# Patient Record
Sex: Female | Born: 1945 | Race: White | Hispanic: No | Smoking: Former smoker
Health system: Southern US, Community
[De-identification: ages and names within clinical notes are randomized; demographics above are authoritative.]

## PROBLEM LIST (undated history)

## (undated) ENCOUNTER — Emergency Department: Payer: Medicare HMO

## (undated) DIAGNOSIS — C801 Malignant (primary) neoplasm, unspecified: Secondary | ICD-10-CM

## (undated) DIAGNOSIS — K219 Gastro-esophageal reflux disease without esophagitis: Secondary | ICD-10-CM

## (undated) DIAGNOSIS — E119 Type 2 diabetes mellitus without complications: Secondary | ICD-10-CM

## (undated) DIAGNOSIS — I10 Essential (primary) hypertension: Secondary | ICD-10-CM

## (undated) DIAGNOSIS — I639 Cerebral infarction, unspecified: Secondary | ICD-10-CM

## (undated) DIAGNOSIS — J449 Chronic obstructive pulmonary disease, unspecified: Secondary | ICD-10-CM

## (undated) DIAGNOSIS — M1990 Unspecified osteoarthritis, unspecified site: Secondary | ICD-10-CM

## (undated) DIAGNOSIS — R609 Edema, unspecified: Secondary | ICD-10-CM

## (undated) DIAGNOSIS — R002 Palpitations: Secondary | ICD-10-CM

## (undated) HISTORY — PX: JOINT REPLACEMENT: SHX530

## (undated) HISTORY — PX: ABDOMINAL HYSTERECTOMY: SHX81

---

## 2002-06-29 DIAGNOSIS — I639 Cerebral infarction, unspecified: Secondary | ICD-10-CM

## 2002-06-29 HISTORY — DX: Cerebral infarction, unspecified: I63.9

## 2009-02-12 ENCOUNTER — Ambulatory Visit: Payer: Self-pay | Admitting: Internal Medicine

## 2009-04-11 ENCOUNTER — Ambulatory Visit: Payer: Self-pay | Admitting: Family Medicine

## 2009-05-21 ENCOUNTER — Ambulatory Visit: Payer: Self-pay | Admitting: Family Medicine

## 2009-08-29 ENCOUNTER — Ambulatory Visit: Payer: Self-pay | Admitting: Gastroenterology

## 2009-09-12 ENCOUNTER — Ambulatory Visit: Payer: Self-pay | Admitting: Gastroenterology

## 2010-01-11 IMAGING — MG MM CAD SCREENING MAMMO
1 series · 7 of 7 positions shown · non-contrast
Comparison: none

REASON FOR EXAM: scr mammo
COMMENTS:

PROCEDURE:     MAM - MAM DGTL SCREENING MAMMO W/CAD  - May 21, 2009  [DATE]
RESULT:     Comparison is made to 06/16/1996, 07/25/2001 and 08/05/2001.

[R CC · right · 7 of 7 slices shown]
[im 1/7]
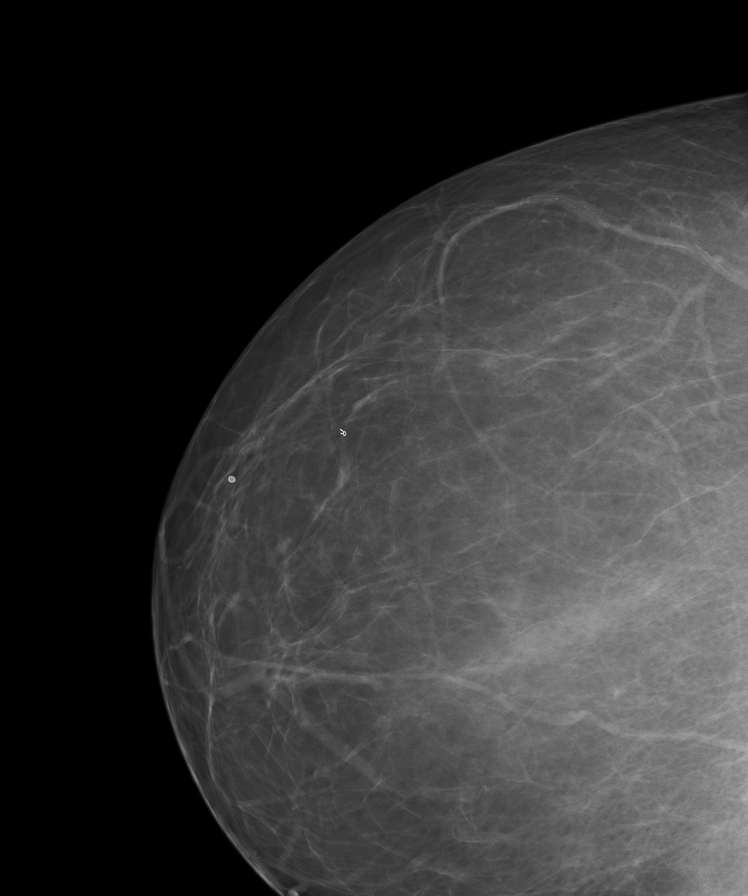
[im 2/7]
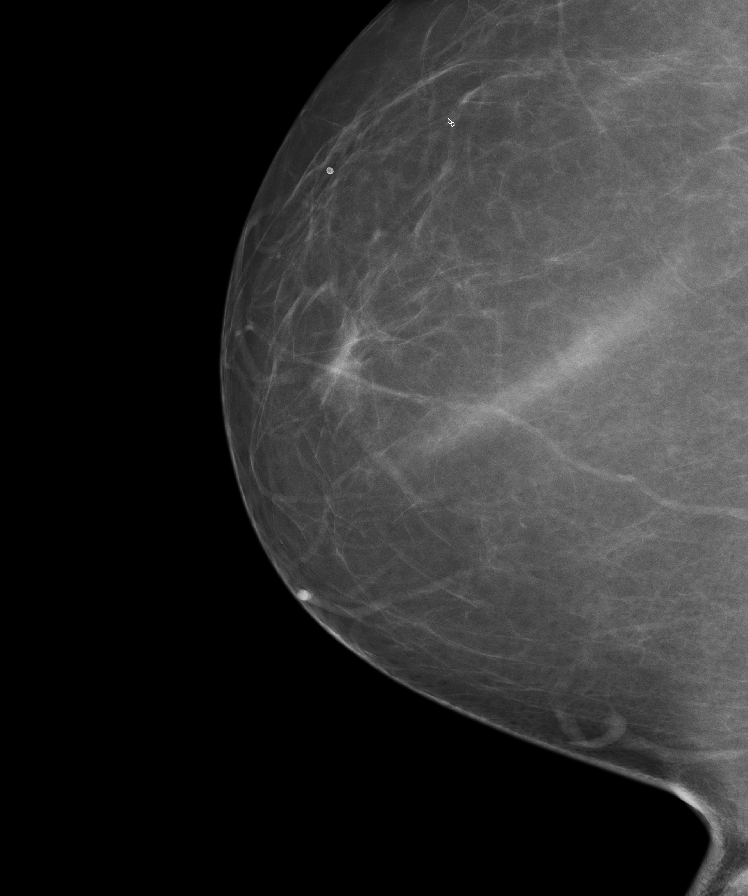
[im 3/7]
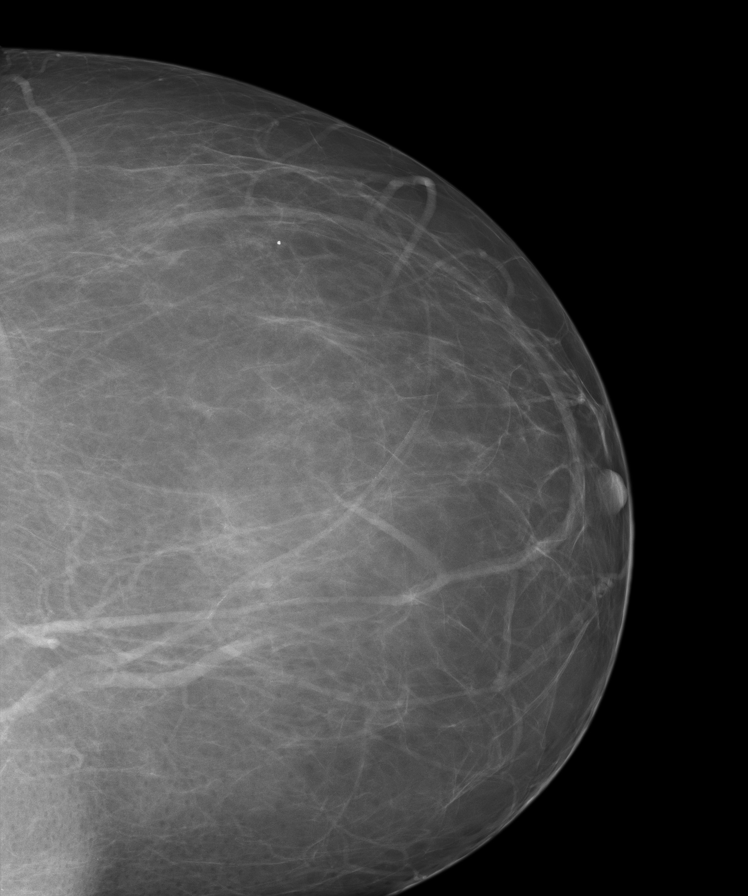
[im 4/7]
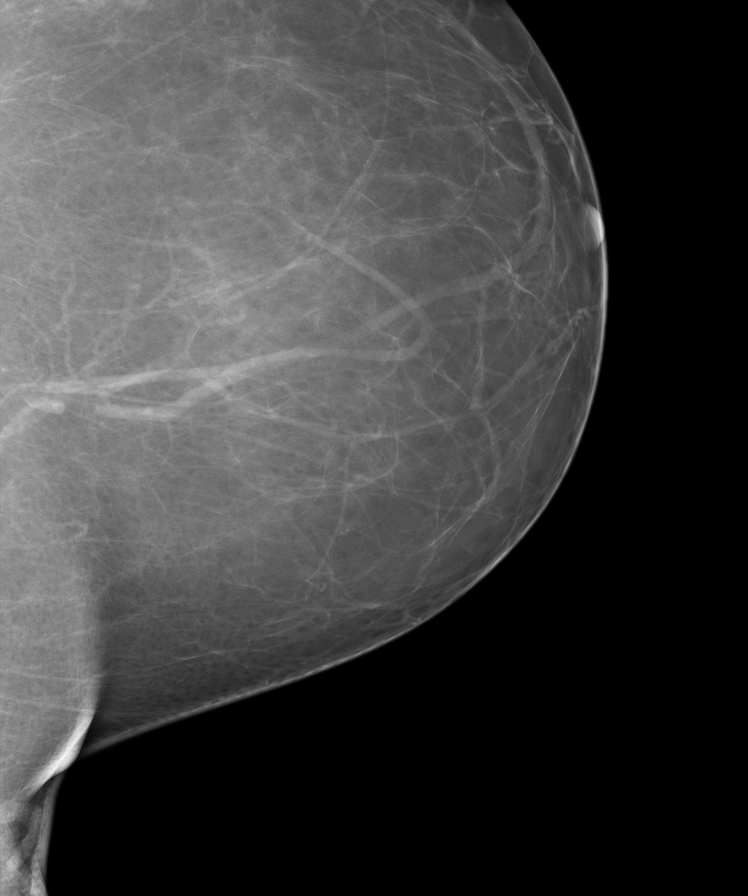
[im 5/7]
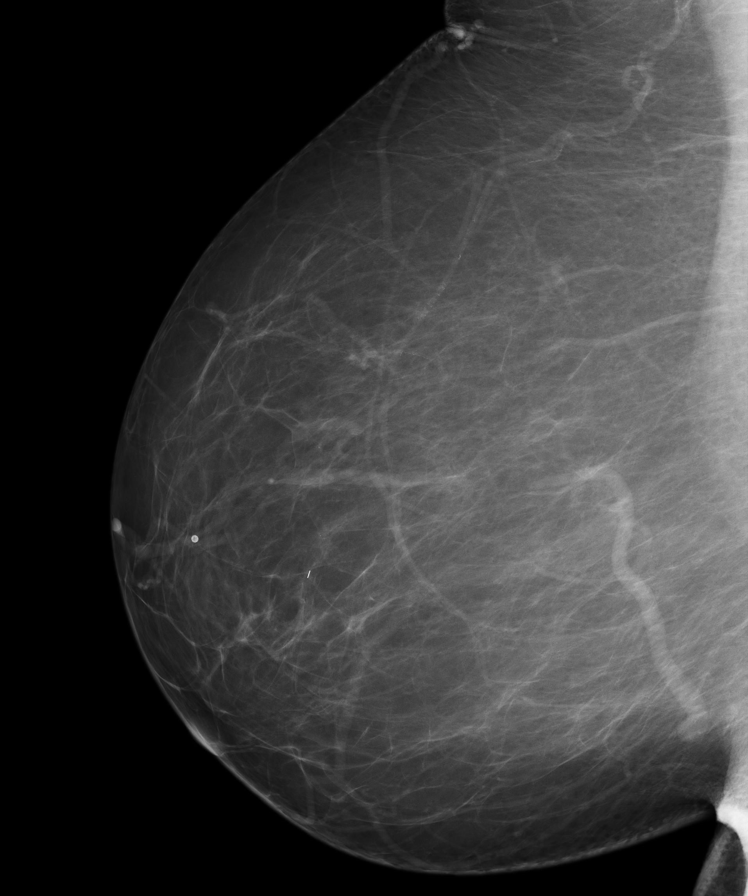
[im 6/7]
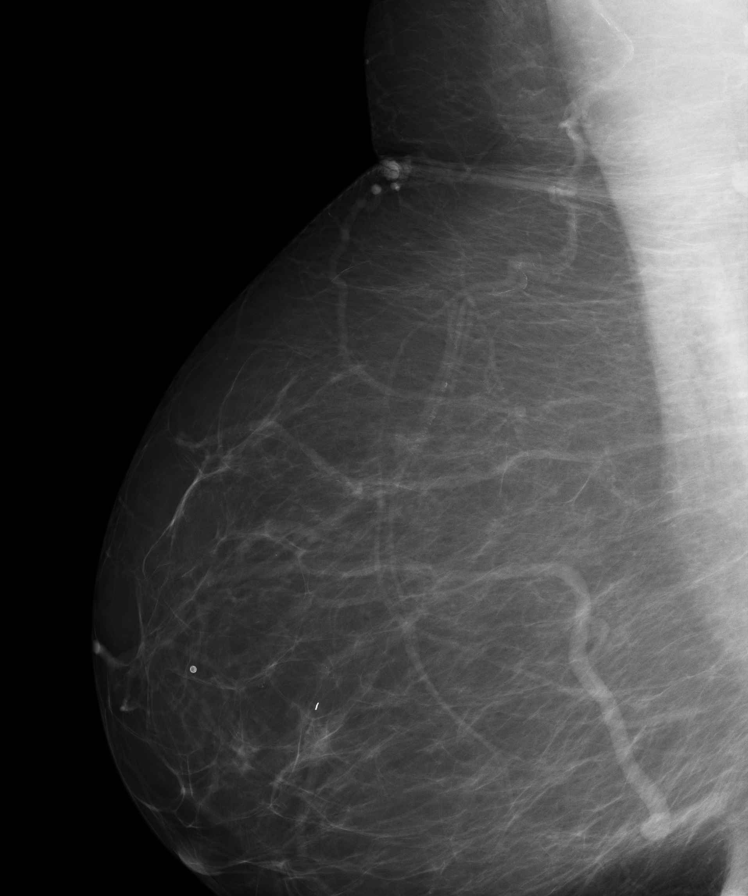
[im 7/7]
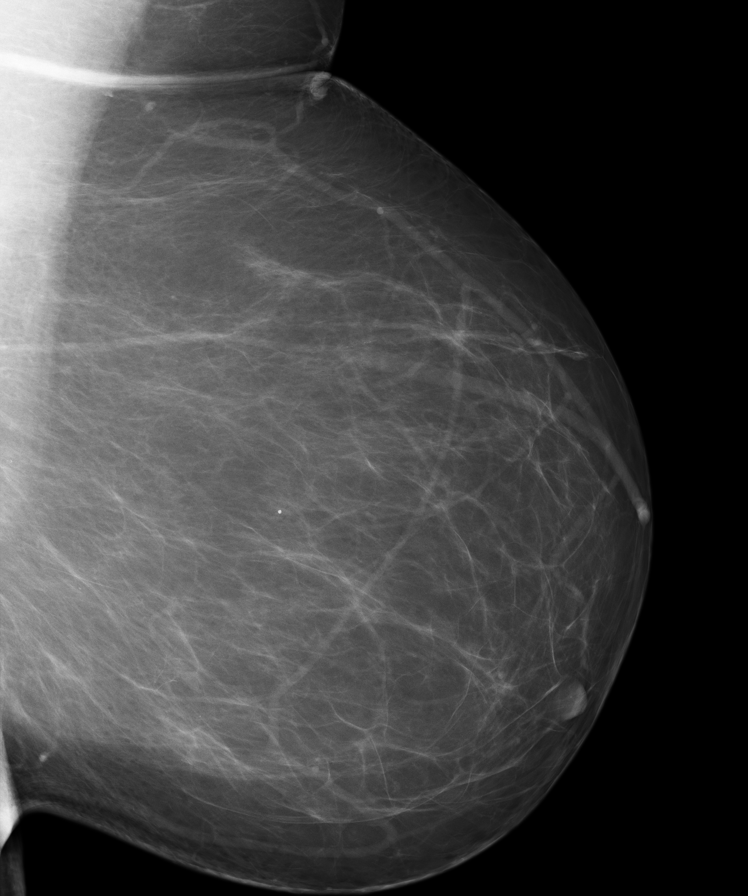

[7 of 7 positions shown; findings below may reference images not displayed]

FINDINGS: Bilateral breasts demonstrate a primarily fat density. There is no
dominant mass, architectural distortion or clusters of suspicious appearing
microcalcifications. There is a biopsy clip in the inferior lateral right
breast.
IMPRESSION: 1.      Stable bilateral mammogram.
2.     Annual mammographic follow-up is recommended.

BI-RADS: Category 2 - Benign Findings

Thank you for this opportunity to contribute to the care of your patient.

A NEGATIVE MAMMOGRAM REPORT DOES NOT PRECLUDE BIOPSY OR OTHER EVALUATION OF
A CLINICALLY PALPABLE OR OTHERWISE SUSPICIOUS MASS OR LESION. BREAST CANCER
MAY NOT BE DETECTED BY MAMMOGRAPHY IN UP TO 10% OF CASES.

## 2010-08-28 ENCOUNTER — Ambulatory Visit: Payer: Self-pay | Admitting: Family Medicine

## 2012-04-01 ENCOUNTER — Ambulatory Visit: Payer: Self-pay | Admitting: Unknown Physician Specialty

## 2012-04-26 ENCOUNTER — Ambulatory Visit: Payer: Self-pay | Admitting: Cardiology

## 2012-05-14 ENCOUNTER — Emergency Department: Payer: Self-pay | Admitting: Emergency Medicine

## 2012-05-14 LAB — CBC
HCT: 42.1 % (ref 35.0–47.0)
HGB: 14.1 g/dL (ref 12.0–16.0)
MCH: 29.9 pg (ref 26.0–34.0)
MCHC: 33.5 g/dL (ref 32.0–36.0)
RDW: 13.7 % (ref 11.5–14.5)
WBC: 11.1 10*3/uL — ABNORMAL HIGH (ref 3.6–11.0)

## 2012-05-14 LAB — LIPASE, BLOOD: Lipase: 261 U/L (ref 73–393)

## 2012-05-14 LAB — COMPREHENSIVE METABOLIC PANEL
Albumin: 3.8 g/dL (ref 3.4–5.0)
Anion Gap: 9 (ref 7–16)
BUN: 18 mg/dL (ref 7–18)
Calcium, Total: 9.5 mg/dL (ref 8.5–10.1)
Chloride: 101 mmol/L (ref 98–107)
Creatinine: 0.95 mg/dL (ref 0.60–1.30)
EGFR (African American): >60
EGFR (Non-African Amer.): >60
Potassium: 4.3 mmol/L (ref 3.5–5.1)
SGOT(AST): 22 U/L (ref 15–37)
SGPT (ALT): 17 U/L (ref 12–78)

## 2012-05-15 LAB — URINALYSIS, COMPLETE
Glucose,UR: NEGATIVE mg/dL (ref 0–75)
Ketone: NEGATIVE
Leukocyte Esterase: NEGATIVE
Nitrite: NEGATIVE
Protein: 30
RBC,UR: 3 /HPF (ref 0–5)
WBC UR: 3 /HPF (ref 0–5)

## 2012-06-29 HISTORY — PX: CORONARY ARTERY BYPASS GRAFT: SHX141

## 2013-07-14 ENCOUNTER — Ambulatory Visit: Payer: Self-pay | Admitting: Medical

## 2013-10-02 ENCOUNTER — Ambulatory Visit: Payer: Self-pay | Admitting: Family Medicine

## 2013-10-25 ENCOUNTER — Ambulatory Visit: Payer: Self-pay | Admitting: Vascular Surgery

## 2014-01-11 ENCOUNTER — Ambulatory Visit: Payer: Self-pay | Admitting: Unknown Physician Specialty

## 2015-05-22 ENCOUNTER — Encounter: Payer: Self-pay | Admitting: *Deleted

## 2015-05-30 ENCOUNTER — Encounter
Admission: RE | Disposition: A | Discharge status: Home / Self Care | Payer: Self-pay | Source: Ambulatory Visit | Attending: Ophthalmology

## 2015-05-30 ENCOUNTER — Ambulatory Visit: Payer: Medicare HMO | Admitting: Certified Registered Nurse Anesthetist

## 2015-05-30 ENCOUNTER — Ambulatory Visit
Admission: RE | Admit: 2015-05-30 | Discharge: 2015-05-30 | Disposition: A | Discharge status: Home / Self Care | Payer: Medicare HMO | Source: Ambulatory Visit | Attending: Ophthalmology | Admitting: Ophthalmology

## 2015-05-30 ENCOUNTER — Encounter: Payer: Self-pay | Admitting: *Deleted

## 2015-05-30 DIAGNOSIS — Z90710 Acquired absence of both cervix and uterus: Secondary | ICD-10-CM | POA: Insufficient documentation

## 2015-05-30 DIAGNOSIS — E7800 Pure hypercholesterolemia, unspecified: Secondary | ICD-10-CM | POA: Insufficient documentation

## 2015-05-30 DIAGNOSIS — J449 Chronic obstructive pulmonary disease, unspecified: Secondary | ICD-10-CM | POA: Diagnosis not present

## 2015-05-30 DIAGNOSIS — M810 Age-related osteoporosis without current pathological fracture: Secondary | ICD-10-CM | POA: Diagnosis not present

## 2015-05-30 DIAGNOSIS — Z8673 Personal history of transient ischemic attack (TIA), and cerebral infarction without residual deficits: Secondary | ICD-10-CM | POA: Diagnosis not present

## 2015-05-30 DIAGNOSIS — Z951 Presence of aortocoronary bypass graft: Secondary | ICD-10-CM | POA: Diagnosis not present

## 2015-05-30 DIAGNOSIS — K219 Gastro-esophageal reflux disease without esophagitis: Secondary | ICD-10-CM | POA: Diagnosis not present

## 2015-05-30 DIAGNOSIS — Z96653 Presence of artificial knee joint, bilateral: Secondary | ICD-10-CM | POA: Insufficient documentation

## 2015-05-30 DIAGNOSIS — M1990 Unspecified osteoarthritis, unspecified site: Secondary | ICD-10-CM | POA: Diagnosis not present

## 2015-05-30 DIAGNOSIS — R002 Palpitations: Secondary | ICD-10-CM | POA: Diagnosis not present

## 2015-05-30 DIAGNOSIS — M7989 Other specified soft tissue disorders: Secondary | ICD-10-CM | POA: Diagnosis not present

## 2015-05-30 DIAGNOSIS — I10 Essential (primary) hypertension: Secondary | ICD-10-CM | POA: Diagnosis not present

## 2015-05-30 DIAGNOSIS — E119 Type 2 diabetes mellitus without complications: Secondary | ICD-10-CM | POA: Diagnosis not present

## 2015-05-30 DIAGNOSIS — H2512 Age-related nuclear cataract, left eye: Secondary | ICD-10-CM | POA: Insufficient documentation

## 2015-05-30 HISTORY — DX: Essential (primary) hypertension: I10

## 2015-05-30 HISTORY — DX: Edema, unspecified: R60.9

## 2015-05-30 HISTORY — DX: Cerebral infarction, unspecified: I63.9

## 2015-05-30 HISTORY — DX: Type 2 diabetes mellitus without complications: E11.9

## 2015-05-30 HISTORY — DX: Unspecified osteoarthritis, unspecified site: M19.90

## 2015-05-30 HISTORY — DX: Chronic obstructive pulmonary disease, unspecified: J44.9

## 2015-05-30 HISTORY — DX: Palpitations: R00.2

## 2015-05-30 HISTORY — PX: CATARACT EXTRACTION W/PHACO: SHX586

## 2015-05-30 HISTORY — DX: Gastro-esophageal reflux disease without esophagitis: K21.9

## 2015-05-30 LAB — GLUCOSE, CAPILLARY: Glucose-Capillary: 122 mg/dL — ABNORMAL HIGH (ref 65–99)

## 2015-05-30 SURGERY — PHACOEMULSIFICATION, CATARACT, WITH IOL INSERTION
Anesthesia: Monitor Anesthesia Care | Laterality: Left | Wound class: Clean

## 2015-05-30 MED ORDER — PHENYLEPHRINE HCL 10 % OP SOLN
OPHTHALMIC | Status: AC
  Filled 2015-05-30: qty 5

## 2015-05-30 MED ORDER — NA HYALUR & NA CHOND-NA HYALUR 0.4-0.35 ML IO KIT
PACK | INTRAOCULAR | Status: DC | PRN
  Administered 2015-05-30: 1 mL via INTRAOCULAR

## 2015-05-30 MED ORDER — HYDRALAZINE HCL 20 MG/ML IJ SOLN
INTRAMUSCULAR | Status: DC | PRN
  Administered 2015-05-30: 10 mg via INTRAVENOUS

## 2015-05-30 MED ORDER — PHENYLEPHRINE HCL 10 % OP SOLN
1.0000 [drp] | OPHTHALMIC | Status: DC | PRN
  Administered 2015-05-30: 1 [drp] via OPHTHALMIC

## 2015-05-30 MED ORDER — LIDOCAINE HCL (PF) 4 % IJ SOLN
INTRAMUSCULAR | Status: AC
  Filled 2015-05-30: qty 5

## 2015-05-30 MED ORDER — LIDOCAINE HCL (PF) 4 % IJ SOLN
INTRAOCULAR | Status: DC | PRN
  Administered 2015-05-30: 1 mL via OPHTHALMIC

## 2015-05-30 MED ORDER — MOXIFLOXACIN HCL 0.5 % OP SOLN
OPHTHALMIC | Status: DC | PRN
  Administered 2015-05-30: 2 [drp] via OPHTHALMIC

## 2015-05-30 MED ORDER — MOXIFLOXACIN HCL 0.5 % OP SOLN
1.0000 [drp] | OPHTHALMIC | Status: DC | PRN
  Administered 2015-05-30: 1 [drp] via OPHTHALMIC

## 2015-05-30 MED ORDER — CYCLOPENTOLATE HCL 2 % OP SOLN
OPHTHALMIC | Status: AC
  Filled 2015-05-30: qty 2

## 2015-05-30 MED ORDER — MIDAZOLAM HCL 2 MG/2ML IJ SOLN
INTRAMUSCULAR | Status: DC | PRN
  Administered 2015-05-30 (×2): 0.5 mg via INTRAVENOUS

## 2015-05-30 MED ORDER — TRYPAN BLUE 0.06 % OP SOLN
OPHTHALMIC | Status: AC
  Filled 2015-05-30: qty 0.5

## 2015-05-30 MED ORDER — TETRACAINE HCL 0.5 % OP SOLN
OPHTHALMIC | Status: AC
  Filled 2015-05-30: qty 2

## 2015-05-30 MED ORDER — SODIUM CHLORIDE 0.9 % IV SOLN
INTRAVENOUS | Status: DC
  Administered 2015-05-30: 08:00:00 via INTRAVENOUS

## 2015-05-30 MED ORDER — NA HYALUR & NA CHOND-NA HYALUR 0.55-0.5 ML IO KIT
PACK | INTRAOCULAR | Status: AC
  Filled 2015-05-30: qty 1.05

## 2015-05-30 MED ORDER — EPINEPHRINE HCL 1 MG/ML IJ SOLN
INTRAMUSCULAR | Status: AC
  Filled 2015-05-30: qty 1

## 2015-05-30 MED ORDER — TETRACAINE HCL 0.5 % OP SOLN
1.0000 [drp] | OPHTHALMIC | Status: DC | PRN
  Administered 2015-05-30: 1 [drp] via OPHTHALMIC

## 2015-05-30 MED ORDER — CEFUROXIME OPHTHALMIC INJECTION 1 MG/0.1 ML
INJECTION | OPHTHALMIC | Status: DC | PRN
  Administered 2015-05-30: .1 mL via INTRACAMERAL

## 2015-05-30 MED ORDER — CEFUROXIME OPHTHALMIC INJECTION 1 MG/0.1 ML
INJECTION | OPHTHALMIC | Status: AC
  Filled 2015-05-30: qty 0.1

## 2015-05-30 MED ORDER — CYCLOPENTOLATE HCL 2 % OP SOLN
1.0000 [drp] | OPHTHALMIC | Status: DC | PRN
  Administered 2015-05-30: 1 [drp] via OPHTHALMIC

## 2015-05-30 MED ORDER — NA CHONDROIT SULF-NA HYALURON 40-30 MG/ML IO SOLN
INTRAOCULAR | Status: DC | PRN
  Administered 2015-05-30: .5 mL via INTRAOCULAR

## 2015-05-30 MED ORDER — TETRACAINE 0.5 % OP SOLN OPTIME - NO CHARGE
OPHTHALMIC | Status: DC | PRN
  Administered 2015-05-30: 4 [drp] via OPHTHALMIC

## 2015-05-30 MED ORDER — NA CHONDROIT SULF-NA HYALURON 40-30 MG/ML IO SOLN
INTRAOCULAR | Status: AC
  Filled 2015-05-30: qty 0.5

## 2015-05-30 MED ORDER — MOXIFLOXACIN HCL 0.5 % OP SOLN
OPHTHALMIC | Status: AC
  Filled 2015-05-30: qty 3

## 2015-05-30 MED ORDER — GLYCOPYRROLATE 0.2 MG/ML IJ SOLN
INTRAMUSCULAR | Status: DC | PRN
  Administered 2015-05-30: 0.2 mg via INTRAVENOUS

## 2015-05-30 SURGICAL SUPPLY — 25 items
CANNULA ANT/CHMB 27GA (MISCELLANEOUS) ×3 IMPLANT
CUP MEDICINE 2OZ PLAST GRAD ST (MISCELLANEOUS) ×3 IMPLANT
GLOVE BIO SURGEON STRL SZ7 (GLOVE) ×3 IMPLANT
GLOVE SURG LX 6.5 MICRO (GLOVE) ×4
GLOVE SURG LX STRL 6.5 MICRO (GLOVE) ×2 IMPLANT
GOWN STRL REUS W/ TWL LRG LVL3 (GOWN DISPOSABLE) ×2 IMPLANT
GOWN STRL REUS W/TWL LRG LVL3 (GOWN DISPOSABLE) ×4
LENS IOL ACRSF MP 23.5 (Intraocular Lens) ×1 IMPLANT
LENS IOL ACRYSOF POST 23.5 (Intraocular Lens) ×3 IMPLANT
NEEDLE FILTER BLUNT 18X 1/2SAF (NEEDLE) ×2
NEEDLE FILTER BLUNT 18X1 1/2 (NEEDLE) ×1 IMPLANT
PACK CATARACT (MISCELLANEOUS) ×3 IMPLANT
PACK CATARACT BRASINGTON LX (MISCELLANEOUS) ×3 IMPLANT
PACK EYE AFTER SURG (MISCELLANEOUS) ×3 IMPLANT
RING MALYGIN (MISCELLANEOUS) ×3 IMPLANT
SOL BAL SALT 15ML (MISCELLANEOUS) ×3
SOL BSS BAG (MISCELLANEOUS) ×3
SOL PREP PVP 2OZ (MISCELLANEOUS) ×3
SOLUTION BAL SALT 15ML (MISCELLANEOUS) ×1 IMPLANT
SOLUTION BSS BAG (MISCELLANEOUS) ×1 IMPLANT
SOLUTION PREP PVP 2OZ (MISCELLANEOUS) ×1 IMPLANT
SYR 3ML LL SCALE MARK (SYRINGE) ×6 IMPLANT
SYR TB 1ML 27GX1/2 LL (SYRINGE) ×3 IMPLANT
WATER STERILE IRR 1000ML POUR (IV SOLUTION) ×3 IMPLANT
WIPE NON LINTING 3.25X3.25 (MISCELLANEOUS) ×3 IMPLANT

## 2015-05-30 NOTE — Transfer of Care (Signed)
Immediate Anesthesia Transfer of Care Note  Patient: Jody Stewart  Procedure(s) Performed: Procedure(s) with comments: CATARACT EXTRACTION PHACO AND INTRAOCULAR LENS PLACEMENT (IOC) (Left) - [place]    1:46.3 AP      11.5 CDE    12.15 casette lot [id number]  Patient Location: PACU  Anesthesia Type:MAC  Level of Consciousness: awake, alert  and oriented  Airway & Oxygen Therapy: Patient Spontanous Breathing  Post-op Assessment: Report given to RN and Post -op Vital signs reviewed and stable  Post vital signs: Reviewed and stable  Last Vitals:  Filed Vitals:   05/30/15 0712 05/30/15 0924  BP: 168/58 143/52  Pulse: 45 58  Temp: 36.5 C 35.6 C  Resp: 16 16    Complications: No apparent anesthesia complications

## 2015-05-30 NOTE — Op Note (Signed)
05/30/2015  PRE-OP DIAGNOSIS: Cataract (ICD-10 H25.12) Nuclear sclerotic cataract, LEFT EYE Poor pupillary dilation, LEFT EYE  Post operative diagnosis: Cataract (ICD-10 H25.12) Nuclear sclerotic cataract, LEFT EYE Poor pupillary dilation, LEFT EYE  Procedure: Phacoemulsification with introcular lens implant, complicated (99991111)   SURGEON: Surgeon(s) and Role:    * Lyla Glassing, MD - Primary  ANESTHESIA: Choice   ESTIMATED BLOOD LOSS: MINIMAL  COMPLICATIONS: None  OPERATIVE DESCRIPTION:  Therapeutic options were discussed with the patient preoperatively, including a discussion of risks and benefits of surgery.  Informed consent was obtained. A dilated fundus exam was performed within 6 months.   The patient was premedicated and brought to the operating room and placed on the operating table in the supine position.  Topical tetracaine was instilled.  After adequate anesthesia, the patient was prepped and draped in the usual fashion.  A wire lid speculum was inserted and the microscope was positioned.  A sideport was used to create a paracentesis site and a mixture of preservative-free lidocaine, BSS, and epinephrine was was instilled into the anterior chamber, followed by viscoelastic.  A clear corneal incision was created using a keratome blade.  Due to poor pupillary dilation, a malyugin ring was inserted. Capsulorrhexis was then performed.  In situ phacoemulsification was performed.  Cortical material was removed with the irrigation-aspiration unit.  Viscoelastic was instilled to open the capsular bag.  A posterior chamber intraocular lens, model 26.0 diopters, was inserted and positioned. The malyugin ring was removed from the eye. Irrigation-aspiration was used to remove all viscoelastic. Intracameral cefuroxime was injected into the eye. Wounds were checked for leakage and confirmed to be secure.  Lid speculum was removed and a shield was placed over the eye.  Patient was returned to  the recovery room in stable condition.  IMPLANTS:   Implant Name Type Inv. Item Serial No. Manufacturer Lot No. LRB No. Used  LENS INTRAOCULAR 23.5D - [id number] Intraocular Lens LENS INTRAOCULAR 23.5D [id number] [employer]   Left 1     Postoperative care and discharge medication counseling was discussed with the patient or the parents prior to discharge

## 2015-05-30 NOTE — H&P (Signed)
The history and physical was faxed to the hospital. The history and physical was reviewed by me and no changes have occurred.   

## 2015-05-30 NOTE — Anesthesia Procedure Notes (Signed)
Procedure Name: MAC Date/Time: 05/30/2015 8:33 AM Performed by: Johnna Acosta Pre-anesthesia Checklist: Patient identified, Emergency Drugs available, Suction available and Patient being monitored Patient Re-evaluated:Patient Re-evaluated prior to inductionOxygen Delivery Method: Nasal cannula

## 2015-05-30 NOTE — Anesthesia Preprocedure Evaluation (Signed)
Anesthesia Evaluation  Patient identified by MRN, date of birth, ID band Patient awake    Reviewed: Allergy & Precautions, H&P , NPO status , Patient's Chart, lab work & pertinent test results, reviewed documented beta blocker date and time   Airway Mallampati: II  TM Distance: >3 FB Neck ROM: full    Dental no notable dental hx.    Pulmonary neg pulmonary ROS, COPD, Current Smoker,    Pulmonary exam normal breath sounds clear to auscultation       Cardiovascular Exercise Tolerance: Good hypertension, negative cardio ROS   Rhythm:regular Rate:Normal     Neuro/Psych CVA, No Residual Symptoms negative neurological ROS  negative psych ROS   GI/Hepatic negative GI ROS, Neg liver ROS, GERD  ,  Endo/Other  negative endocrine ROSdiabetes  Renal/GU negative Renal ROS  negative genitourinary   Musculoskeletal   Abdominal   Peds  Hematology negative hematology ROS (+)   Anesthesia Other Findings   Reproductive/Obstetrics negative OB ROS                             Anesthesia Physical Anesthesia Plan  ASA: III  Anesthesia Plan: General   Post-op Pain Management:    Induction:   Airway Management Planned:   Additional Equipment:   Intra-op Plan:   Post-operative Plan:   Informed Consent: I have reviewed the patients History and Physical, chart, labs and discussed the procedure including the risks, benefits and alternatives for the proposed anesthesia with the patient or authorized representative who has indicated his/her understanding and acceptance.   Dental Advisory Given  Plan Discussed with: CRNA  Anesthesia Plan Comments:         Anesthesia Quick Evaluation

## 2015-05-30 NOTE — Discharge Instructions (Addendum)
Post Operative Instructions  Drop Schedule: Place 1 drop of Durezol in operative eye twice daily Place 1 drop of Ilevro in operative eye once daily Place 1 drop of Vigamox in operative eye four times daily  USE ONLY ONE DROP AT A TIME, WAIT 5 MINUTES BETWEEN DROPS  Eye Protection:  - Do not  rub or squeeze your eyes for one full week. - no strenuous activity or lifting more than 20 pounds during the first week - no hot tubs/saunas/swimming pools for two weeks after surgery - do not get water into your eye (washing your face is permitted but avoid water directly into the eye) - Ensure that you wear the eye shield every night for the first week after surgery   When to call:  It is unusual to have pain following cataract surgery. Pain that is not relieved with Tylenol or Ibuprofen is a reason to call immediately. Vision gradually clears during the first days after surgery; however vision should not get worse or darken. Please call with any unusual symptoms. lights, new floaters, or a curtain over the vision should prompt a phone call.    AMBULATORY SURGERY  DISCHARGE INSTRUCTIONS   1) The drugs that you were given will stay in your system until tomorrow so for the next 24 hours you should not:  A) Drive an automobile B) Make any legal decisions C) Drink any alcoholic beverage   2) You may resume regular meals tomorrow.  Today it is better to start with liquids and gradually work up to solid foods.  You may eat anything you prefer, but it is better to start with liquids, then soup and crackers, and gradually work up to solid foods.   3) Please notify your doctor immediately if you have any unusual bleeding, trouble breathing, redness and pain at the surgery site, drainage, fever, or pain not relieved by medication.    4) Additional Instructions:        Please contact your physician with any problems or Same Day Surgery at [phone], Monday through Friday 6 am to 4 pm,  or [place] at [facility] number at [phone].

## 2015-05-31 NOTE — Anesthesia Postprocedure Evaluation (Signed)
Anesthesia Post Note  Patient: Jody Stewart  Procedure(s) Performed: Procedure(s) (LRB): CATARACT EXTRACTION PHACO AND INTRAOCULAR LENS PLACEMENT (IOC) (Left)  Patient location during evaluation: PACU Anesthesia Type: MAC Level of consciousness: awake and alert Pain management: pain level controlled Vital Signs Assessment: post-procedure vital signs reviewed and stable Respiratory status: spontaneous breathing, nonlabored ventilation, respiratory function stable and patient connected to nasal cannula oxygen Cardiovascular status: stable and blood pressure returned to baseline Anesthetic complications: no    Last Vitals:  Filed Vitals:   05/30/15 0924 05/30/15 0934  BP: 143/52 156/75  Pulse: 58 61  Temp: 35.6 C   Resp: 16     Last Pain: There were no vitals filed for this visit.               Molli Barrows

## 2015-11-29 ENCOUNTER — Encounter: Payer: Self-pay | Admitting: Emergency Medicine

## 2015-11-29 ENCOUNTER — Emergency Department: Payer: Medicare HMO

## 2015-11-29 ENCOUNTER — Ambulatory Visit (INDEPENDENT_AMBULATORY_CARE_PROVIDER_SITE_OTHER)
Admission: EM | Admit: 2015-11-29 | Discharge: 2015-11-29 | Disposition: A | Discharge status: Other Acute Inpatient Hospital | Payer: Medicare HMO | Source: Home / Self Care | Attending: Family Medicine | Admitting: Family Medicine

## 2015-11-29 ENCOUNTER — Encounter: Payer: Self-pay | Admitting: Ophthalmology

## 2015-11-29 ENCOUNTER — Emergency Department
Admission: EM | Admit: 2015-11-29 | Discharge: 2015-11-29 | Disposition: A | Discharge status: Home / Self Care | Payer: Medicare HMO | Attending: Emergency Medicine | Admitting: Emergency Medicine

## 2015-11-29 DIAGNOSIS — E119 Type 2 diabetes mellitus without complications: Secondary | ICD-10-CM | POA: Insufficient documentation

## 2015-11-29 DIAGNOSIS — M1990 Unspecified osteoarthritis, unspecified site: Secondary | ICD-10-CM | POA: Diagnosis not present

## 2015-11-29 DIAGNOSIS — R079 Chest pain, unspecified: Secondary | ICD-10-CM

## 2015-11-29 DIAGNOSIS — Z951 Presence of aortocoronary bypass graft: Secondary | ICD-10-CM | POA: Insufficient documentation

## 2015-11-29 DIAGNOSIS — Z79899 Other long term (current) drug therapy: Secondary | ICD-10-CM

## 2015-11-29 DIAGNOSIS — Z8673 Personal history of transient ischemic attack (TIA), and cerebral infarction without residual deficits: Secondary | ICD-10-CM | POA: Insufficient documentation

## 2015-11-29 DIAGNOSIS — I209 Angina pectoris, unspecified: Secondary | ICD-10-CM | POA: Diagnosis not present

## 2015-11-29 DIAGNOSIS — K219 Gastro-esophageal reflux disease without esophagitis: Secondary | ICD-10-CM

## 2015-11-29 DIAGNOSIS — J449 Chronic obstructive pulmonary disease, unspecified: Secondary | ICD-10-CM | POA: Insufficient documentation

## 2015-11-29 DIAGNOSIS — R0789 Other chest pain: Secondary | ICD-10-CM | POA: Insufficient documentation

## 2015-11-29 DIAGNOSIS — I10 Essential (primary) hypertension: Secondary | ICD-10-CM | POA: Insufficient documentation

## 2015-11-29 DIAGNOSIS — F17210 Nicotine dependence, cigarettes, uncomplicated: Secondary | ICD-10-CM | POA: Insufficient documentation

## 2015-11-29 DIAGNOSIS — Z7984 Long term (current) use of oral hypoglycemic drugs: Secondary | ICD-10-CM

## 2015-11-29 DIAGNOSIS — M62838 Other muscle spasm: Secondary | ICD-10-CM | POA: Diagnosis not present

## 2015-11-29 LAB — CBC
HCT: 35.7 % (ref 35.0–47.0)
HEMOGLOBIN: 11.8 g/dL — AB (ref 12.0–16.0)
MCH: 29.8 pg (ref 26.0–34.0)
MCHC: 33 g/dL (ref 32.0–36.0)
MCV: 90.3 fL (ref 80.0–100.0)
Platelets: 226 10*3/uL (ref 150–440)
RBC: 3.95 MIL/uL (ref 3.80–5.20)
RDW: 13.4 % (ref 11.5–14.5)
WBC: 6.7 10*3/uL (ref 3.6–11.0)

## 2015-11-29 LAB — BASIC METABOLIC PANEL
ANION GAP: 11 (ref 5–15)
BUN: 36 mg/dL — ABNORMAL HIGH (ref 6–20)
CHLORIDE: 109 mmol/L (ref 101–111)
CO2: 21 mmol/L — AB (ref 22–32)
Calcium: 9.1 mg/dL (ref 8.9–10.3)
Creatinine, Ser: 1.7 mg/dL — ABNORMAL HIGH (ref 0.44–1.00)
GFR calc non Af Amer: 29 mL/min — ABNORMAL LOW (ref >60)
GFR, EST AFRICAN AMERICAN: 34 mL/min — AB (ref >60)
Glucose, Bld: 73 mg/dL (ref 65–99)
Potassium: 4 mmol/L (ref 3.5–5.1)
SODIUM: 141 mmol/L (ref 135–145)

## 2015-11-29 LAB — TROPONIN I
Troponin I: <0.03 ng/mL (ref <0.031)
Troponin I: <0.03 ng/mL (ref <0.031)

## 2015-11-29 MED ORDER — SODIUM CHLORIDE 0.9 % IV BOLUS (SEPSIS)
1000.0000 mL | Freq: Once | INTRAVENOUS | Status: AC
  Administered 2015-11-29: 1000 mL via INTRAVENOUS

## 2015-11-29 MED ORDER — CYCLOBENZAPRINE HCL 10 MG PO TABS: 10.0000 mg | ORAL_TABLET | Freq: Three times a day (TID) | ORAL | Status: DC | PRN

## 2015-11-29 MED ORDER — ASPIRIN 81 MG PO CHEW
324.0000 mg | CHEWABLE_TABLET | Freq: Once | ORAL | Status: AC
  Administered 2015-11-29: 324 mg via ORAL

## 2015-11-29 MED ORDER — HYDROCODONE-ACETAMINOPHEN 5-325 MG PO TABS
1.0000 | ORAL_TABLET | Freq: Once | ORAL | Status: AC
  Administered 2015-11-29: 1 via ORAL

## 2015-11-29 MED ORDER — IBUPROFEN 400 MG PO TABS
400.0000 mg | ORAL_TABLET | Freq: Once | ORAL | Status: AC
  Administered 2015-11-29: 400 mg via ORAL
  Filled 2015-11-29: qty 1

## 2015-11-29 MED ORDER — HYDROCODONE-ACETAMINOPHEN 5-325 MG PO TABS
1.0000 | ORAL_TABLET | Freq: Once | ORAL | Status: AC
  Administered 2015-11-29: 1 via ORAL
  Filled 2015-11-29: qty 1

## 2015-11-29 MED ORDER — CEFPODOXIME PROXETIL 200 MG PO TABS: 200.0000 mg | ORAL_TABLET | Freq: Two times a day (BID) | ORAL | Status: DC

## 2015-11-29 MED ORDER — CYCLOBENZAPRINE HCL 10 MG PO TABS
5.0000 mg | ORAL_TABLET | Freq: Once | ORAL | Status: AC
  Administered 2015-11-29: 5 mg via ORAL
  Filled 2015-11-29: qty 1

## 2015-11-29 MED ORDER — HYDROCODONE-ACETAMINOPHEN 5-325 MG PO TABS
ORAL_TABLET | ORAL | Status: AC
  Administered 2015-11-29: 1 via ORAL
  Filled 2015-11-29: qty 1

## 2015-11-29 NOTE — ED Notes (Signed)
Patient is complaining of chest pain that started a few days ago but suddenly worsened while at work. Patient states that her left arm is numb and this also occurred at work. Patient had a cardiac episode in February.

## 2015-11-29 NOTE — Discharge Instructions (Signed)
Angina Pectoris °Angina pectoris is a very bad feeling in the chest, neck, or arm. Your doctor may call it angina. There are four types of angina. Angina is caused by a lack of blood in the middle and thickest layer of the heart wall (myocardium). Angina may feel like a crushing or squeezing pain in the chest. It may feel like tightness or heavy pressure in the chest. Some people say it feels like gas, heartburn, or indigestion. Some people have symptoms other than pain. These include: °· Shortness of breath. °· Cold sweats. °· Feeling sick to your stomach (nausea). °· Feeling light-headed. °Many women have chest discomfort and some of the other symptoms. However, women often have different symptoms, such as: °· Feeling tired (fatigue). °· Feeling nervous for no reason. °· Feeling weak for no reason. °· Dizziness or fainting. °Women may have angina without any symptoms. °HOME CARE °· Take medicines only as told by your doctor. °· Take care of other health issues as told by your doctor. These include: °¨ High blood pressure (hypertension). °¨ Diabetes. °· Follow a heart-healthy diet. Your doctor can help you to choose healthy food options and make changes. °· Talk to your doctor to learn more about healthy cooking methods and use them. These include: °¨ Roasting. °¨ Grilling. °¨ Broiling. °¨ Baking. °¨ Poaching. °¨ Steaming. °¨ Stir-frying. °· Follow an exercise program approved by your doctor. °· Keep a healthy weight. Lose weight as told by your doctor. °· Rest when you are tired. °· Learn to manage stress. °· Do not use any tobacco, such as cigarettes, chewing tobacco, or electronic cigarettes. If you need help quitting, ask your doctor. °· If you drink alcohol, and your doctor says it is okay, limit yourself to no more than 1 drink per day. One drink equals 12 ounces of beer, 5 ounces of wine, or 1½ ounces of hard liquor. °· Stop illegal drug use. °· Keep all follow-up visits as told by your doctor. This is  important. °Do not take these medicines unless your doctor says that you can: °· Nonsteroidal anti-inflammatory drugs (NSAIDs). These include: °¨ Ibuprofen. °¨ Naproxen. °¨ Celecoxib. °· Vitamin supplements that have vitamin A, vitamin E, or both. °· Hormone therapy that contains estrogen with or without progestin. °GET HELP RIGHT AWAY IF: °· You have pain in your chest, neck, arm, jaw, stomach, or back that: °¨ Lasts more than a few minutes. °¨ Comes back. °¨ Does not get better after you take medicine under your tongue (sublingual nitroglycerin). °· You have any of these symptoms for no reason: °¨ Gas, heartburn, or indigestion. °¨ Sweating a lot. °¨ Shortness of breath or trouble breathing. °¨ Feeling sick to your stomach or throwing up. °¨ Feeling more tired than usual. °¨ Feeling nervous or worrying more than usual. °¨ Feeling weak. °¨ Diarrhea. °· You are suddenly dizzy or light-headed. °· You faint or pass out. °These symptoms may be an emergency. Do not wait to see if the symptoms will go away. Get medical help right away. Call your local emergency services (911 in the [place] Do not drive yourself to the hospital. °  °This information is not intended to replace advice given to you by your health care provider. Make sure you discuss any questions you have with your health care provider. °  °Document Released: 12/02/2007 Document Revised: 10/30/2014 Document Reviewed: 10/17/2013 °Elsevier Interactive Patient Education ©2016 [employer] ° °

## 2015-11-29 NOTE — Discharge Instructions (Signed)
You were evaluated for left-sided chest and neck discomfort and found to have left trapezius muscle spasm.  Your exam and evaluation are reassuring in the emergency department tonight.  We discussed your kidney function was a little bit decreased, make sure to drink plenty of fluids.  Follow-up with your primary care physician, as well as your cardiologist.  Musculoskeletal Pain Musculoskeletal pain is muscle and boney aches and pains. These pains can occur in any part of the body. Your caregiver may treat you without knowing the cause of the pain. They may treat you if blood or urine tests, X-rays, and other tests were normal.  CAUSES There is often not a definite cause or reason for these pains. These pains may be caused by a type of germ (virus). The discomfort may also come from overuse. Overuse includes working out too hard when your body is not fit. Boney aches also come from weather changes. Bone is sensitive to atmospheric pressure changes. HOME CARE INSTRUCTIONS   Ask when your test results will be ready. Make sure you get your test results.  Only take over-the-counter or prescription medicines for pain, discomfort, or fever as directed by your caregiver. If you were given medications for your condition, do not drive, operate machinery or power tools, or sign legal documents for 24 hours. Do not drink alcohol. Do not take sleeping pills or other medications that may interfere with treatment.  Continue all activities unless the activities cause more pain. When the pain lessens, slowly resume normal activities. Gradually increase the intensity and duration of the activities or exercise.  During periods of severe pain, bed rest may be helpful. Lay or sit in any position that is comfortable.  Putting ice on the injured area.  Put ice in a bag.  Place a towel between your skin and the bag.  Leave the ice on for 15 to 20 minutes, 3 to 4 times a day.  Follow up with your caregiver for  continued problems and no reason can be found for the pain. If the pain becomes worse or does not go away, it may be necessary to repeat tests or do additional testing. Your caregiver may need to look further for a possible cause. SEEK IMMEDIATE MEDICAL CARE IF:  You have pain that is getting worse and is not relieved by medications.  You develop chest pain that is associated with shortness or breath, sweating, feeling sick to your stomach (nauseous), or throw up (vomit).  Your pain becomes localized to the abdomen.  You develop any new symptoms that seem different or that concern you. MAKE SURE YOU:   Understand these instructions.  Will watch your condition.  Will get help right away if you are not doing well or get worse.   This information is not intended to replace advice given to you by your health care provider. Make sure you discuss any questions you have with your health care provider.   Document Released: 06/15/2005 Document Revised: 09/07/2011 Document Reviewed: 02/17/2013 Elsevier Interactive Patient Education [employer].

## 2015-11-29 NOTE — ED Notes (Signed)
Pt from [facility] via EMS c/o CP x1week. Pt states it radiates to left arm, states CP increases with ROM. Hx of cabbage. Pt has 324 apsrin and 1 nitro en route. BP initally 178/80 after nitro 122/70. CBG 74. Pt A&O

## 2015-11-29 NOTE — ED Provider Notes (Addendum)
[facility] Emergency Department Provider Note   ____________________________________________  Time seen:  I have reviewed the triage vital signs and the triage nursing note.  HISTORY  Chief Complaint Chest Pain   Historian Patient and son's  HPI Jody Stewart is a 70 y.o. female with a history of prior CABG, is here for evaluation of left-sided chest and left arm pain. Symptoms started over the past week. 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 She had some pinching and tingling down her left arm, and more severe pain when she woke up this morning. Pain is worse when she moves around. Pain is moderate 7-8 out of 10.She does have some extension of the pain into the anterior left chest wall. No trouble breathing or shortness of breath. No pleuritic chest pain.  No nausea or vomiting. No dizziness or passing out.    Past Medical History  Diagnosis Date  . COPD (chronic obstructive pulmonary disease) ([place])   . Hypertension   . GERD (gastroesophageal reflux disease)   . Arthritis   . Diabetes mellitus without complication ([place])   . Palpitations   . Edema   . Stroke [facility]) 2004    tia    There are no active problems to display for this patient.   Past Surgical History  Procedure Laterality Date  . Abdominal hysterectomy    . Joint replacement  [id number]    bil tkr  . Coronary artery bypass graft  2014  . Cataract extraction w/phaco Left 05/30/2015    Procedure: CATARACT EXTRACTION PHACO AND INTRAOCULAR LENS PLACEMENT (IOC);  Surgeon: Lyla Glassing, MD;  Location: [facility] ORS;  Service: Ophthalmology;  Laterality: Left;  [place]    1:46.3 AP      11.5 CDE    12.15 casette lot [id number]    Current Outpatient Rx  Name  Route  Sig  Dispense  Refill  . acetaminophen (TYLENOL) 650 MG CR tablet   Oral   Take 650 mg by mouth every 8 (eight) hours as needed for pain.         Marland Kitchen albuterol  (PROVENTIL HFA;VENTOLIN HFA) 108 (90 BASE) MCG/ACT inhaler   Inhalation   Inhale into the lungs every 6 (six) hours as needed for wheezing or shortness of breath.         . cefpodoxime (VANTIN) 200 MG tablet   Oral   Take 1 tablet (200 mg total) by mouth 2 (two) times daily.   14 tablet   0   . cyclobenzaprine (FLEXERIL) 10 MG tablet   Oral   Take 1 tablet (10 mg total) by mouth every 8 (eight) hours as needed for muscle spasms.   20 tablet   0   . furosemide (LASIX) 20 MG tablet   Oral   Take 20 mg by mouth daily.         Marland Kitchen lisinopril-hydrochlorothiazide (PRINZIDE,ZESTORETIC) 20-25 MG tablet   Oral   Take 1 tablet by mouth daily.         . metFORMIN (GLUCOPHAGE) 500 MG tablet   Oral   Take by mouth.         . metoprolol tartrate (LOPRESSOR) 25 MG tablet   Oral   Take 25 mg by mouth.         . rosuvastatin (CRESTOR) 40 MG tablet   Oral   Take 40 mg by mouth daily.  Allergies Review of patient's allergies indicates no known allergies.  No family history on file.  Social History Social History  Substance Use Topics  . Smoking status: Current Some Day Smoker -- 0.50 packs/day    Types: Cigarettes  . Smokeless tobacco: None  . Alcohol Use: No    Review of Systems  Constitutional: Negative for fever. Eyes: Negative for visual changes. ENT: Negative for sore throat. Cardiovascular: Negative for Palpitations. Respiratory: Negative for shortness of breath. Gastrointestinal: Negative for abdominal pain, vomiting and diarrhea. Genitourinary: Negative for dysuria. Musculoskeletal: Negative for back pain. Skin: Negative for rash. Neurological: Negative for headache. 10 point Review of Systems otherwise negative ____________________________________________   PHYSICAL EXAM:  VITAL SIGNS: ED Triage Vitals  Enc Vitals Group     BP 11/29/15 1606 142/60 mmHg     Pulse Rate 11/29/15 1606 60     Resp 11/29/15 1606 18     Temp 11/29/15 1614  97.6 F (36.4 C)     Temp Source 11/29/15 1614 Oral     SpO2 11/29/15 1606 95 %     Weight --      Height --      Head Cir --      Peak Flow --      Pain Score 11/29/15 1550 8     Pain Loc --      Pain Edu? --      Excl. in [place]? --      Constitutional: Alert and oriented. Well appearing and in no distress. HEENT   Head: Normocephalic and atraumatic.      Eyes: Conjunctivae are normal. PERRL. Normal extraocular movements.      Ears:         Nose: No congestion/rhinnorhea.   Mouth/Throat: Mucous membranes are moist.   Neck: No stridor.  Point tender and very tight left trapezius muscle spasm. Cardiovascular/Chest: Normal rate, regular rhythm.  No murmurs, rubs, or gallops. Respiratory: Normal respiratory effort without tachypnea nor retractions. Breath sounds are clear and equal bilaterally. No wheezes/rales/rhonchi. Gastrointestinal: Soft. No distention, no guarding, no rebound. Nontender.    Genitourinary/rectal:Deferred Musculoskeletal: Nontender with normal range of motion in all extremities. No joint effusions.  No lower extremity tenderness.  No edema. Neurologic:  Normal speech and language. No gross or focal neurologic deficits are appreciated. Skin:  Skin is warm, dry and intact. No rash noted. Psychiatric: Mood and affect are normal. Speech and behavior are normal. Patient exhibits appropriate insight and judgment.  ____________________________________________   EKG I, Lisa Roca, MD, the attending physician have personally viewed and interpreted all ECGs.  60 bpm. normal sinus rhythm.  Nonspecific intraventricular conduction delay. Normal axis. Normal ST and T-wave ____________________________________________  LABS (pertinent positives/negatives)  Labs Reviewed  BASIC METABOLIC PANEL - Abnormal; Notable for the following:    CO2 21 (*)    BUN 36 (*)    Creatinine, Ser 1.70 (*)    GFR calc non Af Amer 29 (*)    GFR calc Af Amer 34 (*)    All other  components within normal limits  CBC - Abnormal; Notable for the following:    Hemoglobin 11.8 (*)    All other components within normal limits  TROPONIN I  TROPONIN I    ____________________________________________  RADIOLOGY All Xrays were viewed by me. Imaging interpreted by Radiologist.  Chest two-view: No active cardiopulmonary disease. __________________________________________  PROCEDURES  Procedure(s) performed: None  Critical Care performed: None  ____________________________________________   ED COURSE / ASSESSMENT AND  PLAN  Pertinent labs & imaging results that were available during my care of the patient were reviewed by me and considered in my medical decision making (see chart for details).   Patient's chief complaint was chest and neck discomfort, and on physical exam she is pointing to the top of her shoulder at the trapezius muscle which is firm and a muscle spasm and point tender to palpation. Her symptoms seemed consistent with musculoskeletal muscle spasm pain.  She does have a history of CABG, and her EKG is reassuring as is her initial troponin and repeat troponin.  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  I discussed this with her, and that in addition her kidney function is borderline and I don't think that adding contrast dye load to GFR of 29 is a great idea.  We discussed return precautions and she is comfortable with treating as we did here in the emergency department which did alleviate her symptoms.    CONSULTATIONS:   None Patient / Family / Caregiver informed of clinical course, medical decision-making process, and agree with plan.   I discussed return precautions,  follow-up instructions, and discharged instructions with patient and/or family.  Addended to take out Cefpodoxime, this was entered in error. Patient was written a paper prescription for Norco, 5/325mg  #15. ___________________________________________   FINAL CLINICAL IMPRESSION(S) / ED DIAGNOSES   Final diagnoses:  Trapezius muscle spasm  Nonspecific chest pain              Note: This dictation was prepared with Dragon dictation. Any transcriptional errors that result from this process are unintentional   Lisa Roca, MD 11/29/15 2058  Lisa Roca, MD 11/29/15 2113

## 2015-11-29 NOTE — ED Provider Notes (Signed)
CSN: [id number]     Arrival date & time 11/29/15  1430 History   First MD Initiated Contact with Patient 11/29/15 1453    Nurses notes were reviewed. Chief Complaint  Patient presents with  . Chest Pain   Individual with chest pain. She's had open heart surgery before history of ischemia and coronary artery disease. She has history of diabetes she still smokes and she states for the last several days she's had chest pain off and on. Yesterday was her worst until today where today became much worse and she also has shortness of breath and difficult to breathing. She reports discomfort and pain going down the left arm and upper left neck as well. She has a history of COPD hypertension GERD and diabetes and palpitations and unfortunately she still smokes. She states that earlier her heart was racing and she felt infection catch breath. She works for [employer] and states that she was given out of breath while working today and finally called her family and her granddaughter brought her here to be seen and evaluated. She had coronary artery bypass graft in 2014   (Consider location/radiation/quality/duration/timing/severity/associated sxs/prior Treatment) Patient is a 70 y.o. female presenting with chest pain. The history is provided by the patient and a relative.  Chest Pain Pain location:  Substernal area Pain quality: radiating   Pain radiates to:  Upper back, neck, L shoulder and L arm Pain severity:  Moderate Duration:  4 days Timing:  Intermittent Progression:  Worsening Chronicity:  New Context: movement and stress   Relieved by:  Nothing Worsened by:  Movement and exertion Associated symptoms: back pain, dizziness, fatigue, palpitations and shortness of breath   Risk factors: coronary artery disease, diabetes mellitus, hypertension, obesity and smoking     Past Medical History  Diagnosis Date  . COPD (chronic obstructive pulmonary disease) ([place])   . Hypertension   . GERD  (gastroesophageal reflux disease)   . Arthritis   . Diabetes mellitus without complication ([place])   . Palpitations   . Edema   . Stroke [facility]) 2004    tia   Past Surgical History  Procedure Laterality Date  . Abdominal hysterectomy    . Joint replacement  [id number]    bil tkr  . Coronary artery bypass graft  2014  . Cataract extraction w/phaco Left 05/30/2015    Procedure: CATARACT EXTRACTION PHACO AND INTRAOCULAR LENS PLACEMENT (IOC);  Surgeon: Lyla Glassing, MD;  Location: [facility] ORS;  Service: Ophthalmology;  Laterality: Left;  [place]    1:46.3 AP      11.5 CDE    12.15 casette lot [id number]   History reviewed. No pertinent family history. Social History  Substance Use Topics  . Smoking status: Current Some Day Smoker -- 0.50 packs/day    Types: Cigarettes  . Smokeless tobacco: None  . Alcohol Use: No   OB History    No data available     Review of Systems  Constitutional: Positive for fatigue.  Respiratory: Positive for chest tightness and shortness of breath.   Cardiovascular: Positive for chest pain and palpitations.  Musculoskeletal: Positive for myalgias, back pain, neck pain and neck stiffness.  Skin: Negative.   Neurological: Positive for dizziness.  All other systems reviewed and are negative.   Allergies  Review of patient's allergies indicates no known allergies.  Home Medications   Prior to Admission medications   Medication Sig Start Date End Date Taking? Authorizing Provider  acetaminophen (TYLENOL) 650 MG CR tablet Take  650 mg by mouth every 8 (eight) hours as needed for pain.   Yes Historical Provider, MD  albuterol (PROVENTIL HFA;VENTOLIN HFA) 108 (90 BASE) MCG/ACT inhaler Inhale into the lungs every 6 (six) hours as needed for wheezing or shortness of breath.   Yes Historical Provider, MD  lisinopril-hydrochlorothiazide (PRINZIDE,ZESTORETIC) 20-25 MG tablet Take 1 tablet by mouth daily.   Yes Historical Provider, MD  metFORMIN (GLUCOPHAGE) 500 MG  tablet Take by mouth.   Yes Historical Provider, MD  rosuvastatin (CRESTOR) 40 MG tablet Take 40 mg by mouth daily.   Yes Historical Provider, MD  furosemide (LASIX) 20 MG tablet Take 20 mg by mouth daily.    Historical Provider, MD  metoprolol tartrate (LOPRESSOR) 25 MG tablet Take 25 mg by mouth.    Historical Provider, MD   Meds Ordered and Administered this Visit   Medications  aspirin chewable tablet 324 mg (324 mg Oral Given 11/29/15 1450)    BP 188/79 mmHg  Pulse 74  Resp 20  Ht 4\' 10"  (1.473 m)  Wt 168 lb (76.204 kg)  BMI 35.12 kg/m2  SpO2 96% No data found.   Physical Exam  Constitutional: She is oriented to person, place, and time. She appears well-developed and well-nourished.  Patient appears older than stated age  HENT:  Head: Normocephalic and atraumatic.  Eyes: Conjunctivae are normal. Pupils are equal, round, and reactive to light.  Neck: Normal range of motion. Neck supple. No tracheal deviation present.  Cardiovascular: Normal rate and regular rhythm.  Exam reveals distant heart sounds.   Pulmonary/Chest: She has decreased breath sounds.  Abdominal: Soft. Normal appearance.  Musculoskeletal: Normal range of motion. She exhibits no edema or tenderness.  Neurological: She is alert and oriented to person, place, and time.  Skin: Skin is warm. No abrasion and no bruising noted.  Psychiatric: She has a normal mood and affect. Her behavior is normal.  Vitals reviewed.   ED Course  Procedures (including critical care time)  Labs Review Labs Reviewed - No data to display  Imaging Review No results found.   Visual Acuity Review  Right Eye Distance:   Left Eye Distance:   Bilateral Distance:    Right Eye Near:   Left Eye Near:    Bilateral Near:         MDM   1. Ischemic chest pain [facility])     Patient is given oxygen she feels better also given 4 baby aspirin while in the urgent care. EMS was called. Discussed with Lonna Duval at [facility] ED who took  report for charges [place] whose actually in the [place] report was called. Will be transferred by EMS to [facility] for evaluation and treatment. She notes the EKG was abnormal with possible old inferior MI in the patient states she's never had a heart attack before and this nonspecific T-wave changes consistent with possible inferior ischemia.   ED ECG REPORT I, Illya Gienger H, the attending physician, personally viewed and interpreted this ECG.   Date: 11/29/2015  EKG Time:14:41:02  Rate: 70  Rhythm: there are no previous tracings available for comparison, normal sinus rhythm  Axis: -15  Intervals:nonspecific intraventricular conduction delay  ST&T Change:  T wave abnormality possible inferior ischemia   Note: This dictation was prepared with Dragon dictation along with smaller phrase technology. Any transcriptional errors that result from this process are unintentional.    Frederich Cha, MD 11/29/15 1513

## 2016-07-25 ENCOUNTER — Emergency Department: Payer: Medicare HMO

## 2016-07-25 ENCOUNTER — Emergency Department
Admission: EM | Admit: 2016-07-25 | Discharge: 2016-07-25 | Disposition: A | Discharge status: Home / Self Care | Payer: Medicare HMO | Attending: Emergency Medicine | Admitting: Emergency Medicine

## 2016-07-25 ENCOUNTER — Encounter: Payer: Self-pay | Admitting: Emergency Medicine

## 2016-07-25 DIAGNOSIS — Z7984 Long term (current) use of oral hypoglycemic drugs: Secondary | ICD-10-CM | POA: Diagnosis not present

## 2016-07-25 DIAGNOSIS — F17210 Nicotine dependence, cigarettes, uncomplicated: Secondary | ICD-10-CM | POA: Insufficient documentation

## 2016-07-25 DIAGNOSIS — Y999 Unspecified external cause status: Secondary | ICD-10-CM | POA: Diagnosis not present

## 2016-07-25 DIAGNOSIS — S52501A Unspecified fracture of the lower end of right radius, initial encounter for closed fracture: Secondary | ICD-10-CM | POA: Diagnosis not present

## 2016-07-25 DIAGNOSIS — W101XXA Fall (on)(from) sidewalk curb, initial encounter: Secondary | ICD-10-CM | POA: Diagnosis not present

## 2016-07-25 DIAGNOSIS — Y9301 Activity, walking, marching and hiking: Secondary | ICD-10-CM | POA: Diagnosis not present

## 2016-07-25 DIAGNOSIS — S0003XA Contusion of scalp, initial encounter: Secondary | ICD-10-CM | POA: Insufficient documentation

## 2016-07-25 DIAGNOSIS — E119 Type 2 diabetes mellitus without complications: Secondary | ICD-10-CM | POA: Insufficient documentation

## 2016-07-25 DIAGNOSIS — J449 Chronic obstructive pulmonary disease, unspecified: Secondary | ICD-10-CM | POA: Insufficient documentation

## 2016-07-25 DIAGNOSIS — Z79899 Other long term (current) drug therapy: Secondary | ICD-10-CM | POA: Diagnosis not present

## 2016-07-25 DIAGNOSIS — S022XXA Fracture of nasal bones, initial encounter for closed fracture: Secondary | ICD-10-CM | POA: Diagnosis not present

## 2016-07-25 DIAGNOSIS — Y9289 Other specified places as the place of occurrence of the external cause: Secondary | ICD-10-CM | POA: Diagnosis not present

## 2016-07-25 DIAGNOSIS — S0990XA Unspecified injury of head, initial encounter: Secondary | ICD-10-CM | POA: Diagnosis present

## 2016-07-25 DIAGNOSIS — I10 Essential (primary) hypertension: Secondary | ICD-10-CM | POA: Insufficient documentation

## 2016-07-25 MED ORDER — ONDANSETRON 4 MG PO TBDP
4.0000 mg | ORAL_TABLET | Freq: Once | ORAL | Status: AC
  Administered 2016-07-25: 4 mg via ORAL
  Filled 2016-07-25: qty 1

## 2016-07-25 MED ORDER — OXYCODONE-ACETAMINOPHEN 5-325 MG PO TABS: 2.0000 | ORAL_TABLET | Freq: Four times a day (QID) | ORAL | 0 refills | Status: DC | PRN

## 2016-07-25 MED ORDER — OXYCODONE-ACETAMINOPHEN 5-325 MG PO TABS
2.0000 | ORAL_TABLET | Freq: Once | ORAL | Status: AC
  Administered 2016-07-25: 2 via ORAL
  Filled 2016-07-25: qty 2

## 2016-07-25 MED ORDER — HYDROMORPHONE HCL 1 MG/ML IJ SOLN
0.5000 mg | Freq: Once | INTRAMUSCULAR | Status: AC
  Administered 2016-07-25: 0.5 mg via INTRAMUSCULAR
  Filled 2016-07-25: qty 1

## 2016-07-25 NOTE — ED Provider Notes (Signed)
[facility] Emergency Department Provider Note        Time seen: ----------------------------------------- 3:04 PM on 07/25/2016 -----------------------------------------    I have reviewed the triage vital signs and the nursing notes.   HISTORY  Chief Complaint Fall and Head Injury    HPI Jody Stewart is a 71 y.o. female who presents to the ER after having had a fall at the [place] [employer]. Patient states she was walking up on the sidewalk and tripped over the curb and fell forward. She presents with facial pain, right wrist pain and left knee pain. She states she did not lose consciousness, has severe pain in her face and right wrist. Pain with range of motion particularly the right wrist. She also describes a burning pain left knee.   Past Medical History:  Diagnosis Date  . Arthritis   . COPD (chronic obstructive pulmonary disease) ([place])   . Diabetes mellitus without complication ([place])   . Edema   . GERD (gastroesophageal reflux disease)   . Hypertension   . Palpitations   . Stroke [facility]) 2004   tia    There are no active problems to display for this patient.   Past Surgical History:  Procedure Laterality Date  . ABDOMINAL HYSTERECTOMY    . CATARACT EXTRACTION W/PHACO Left 05/30/2015   Procedure: CATARACT EXTRACTION PHACO AND INTRAOCULAR LENS PLACEMENT (IOC);  Surgeon: Lyla Glassing, MD;  Location: [facility] ORS;  Service: Ophthalmology;  Laterality: Left;  [place]    1:46.3 AP      11.5 CDE    12.15 casette lot [id number]  . CORONARY ARTERY BYPASS GRAFT  2014  . JOINT REPLACEMENT  [phone]   bil tkr    Allergies Patient has no known allergies.  Social History Social History  Substance Use Topics  . Smoking status: Current Some Day Smoker    Packs/day: 0.50    Types: Cigarettes  . Smokeless tobacco: Never Used  . Alcohol use No    Review of Systems Constitutional: Negative for fever. Cardiovascular: Negative for chest  pain. Respiratory: Negative for shortness of breath. Gastrointestinal: Negative for abdominal pain, vomiting and diarrhea. Genitourinary: Negative for dysuria. Musculoskeletal: Positive right wrist pain, left knee pain Skin: Positive for bleeding from her nose, nasal bridge bleeding Neurological: Positive for head and facial pain  10-point ROS otherwise negative.  ____________________________________________   PHYSICAL EXAM:  VITAL SIGNS: ED Triage Vitals  Enc Vitals Group     BP 07/25/16 1452 (!) 157/66     Pulse Rate 07/25/16 1452 63     Resp 07/25/16 1452 20     Temp 07/25/16 1452 97.7 F (36.5 C)     Temp Source 07/25/16 1452 Oral     SpO2 07/25/16 1452 95 %     Weight 07/25/16 1453 168 lb (76.2 kg)     Height --      Head Circumference --      Peak Flow --      Pain Score 07/25/16 1453 10     Pain Loc --      Pain Edu? --      Excl. in [place]? --     Constitutional: Alert and oriented. Well appearing and in no distress. Eyes: Conjunctivae are normal. PERRL. Normal extraocular movements. ENT   Head: Multiple facial and frontal scalp hematomas, particularly around the left eye and across the nasal bridge.    Nose: Less than 1 cm superficial laceration across the proximal nasal bridge. Blood is noted  in both nasal passages   Mouth/Throat: Mucous membranes are moist.   Neck: No stridor. Cardiovascular: Normal rate, regular rhythm. No murmurs, rubs, or gallops. Respiratory: Normal respiratory effort without tachypnea nor retractions. Breath sounds are clear and equal bilaterally. No wheezes/rales/rhonchi. Gastrointestinal: Soft and nontender. Normal bowel sounds Musculoskeletal: Pain with range of motion of the right wrist, slight deformity is noted. Abrasion noted over the left patella Neurologic:  Normal speech and language. No gross focal neurologic deficits are appreciated.  Skin:  Abrasions, contusions and laceration as noted above. Psychiatric: Mood and  affect are normal. Speech and behavior are normal.  ____________________________________________  ED COURSE:  Pertinent labs & imaging results that were available during my care of the patient were reviewed by me and considered in my medical decision making (see chart for details). Patient presents to the ER in no distress but with fall and likely nasal fracture and right wrist fracture. We will assess with imaging, provide oral pain medicine.   Procedures ____________________________________________   RADIOLOGY Images were viewed by me  CT head, maxillofacial, right wrist x-rays, left knee x-rays IMPRESSION: Displaced transverse fracture of the distal radial metaphysis as well as displaced oblique fracture of the distal ulnar metaphysis.  Cannot exclude a fracture over the head of the second metacarpal. Recommend right hand series for further evaluation. IMPRESSION: No acute injury.  Mild degenerative change and evidence of previous partial knee replacement which is intact. IMPRESSION: Bilateral nasal bone fractures. The left nasal fracture is displaced by 2 mm. Left facial and forehead soft tissue swelling.  No evidence of acute intracranial abnormality.  Atrophy, chronic small-vessel white matter ischemic changes and remote left basal ganglia infarcts.  ____________________________________________  FINAL ASSESSMENT AND PLAN  Fall, nasal fracture, right distal radius fracture, facial contusion, superficial laceration  Plan: Patient with imaging as dictated above. Patient is in no acute distress at this time, we have placed her in a splint for her wrist. She does have nasal fractures which will need ice and pain medication. She'll be referred to orthopedics and ENT for outpatient follow-up.   Earleen Newport, MD   Note: This note was generated in part or whole with voice recognition software. Voice recognition is usually quite accurate but there are  transcription errors that can and very often do occur. I apologize for any typographical errors that were not detected and corrected.     Earleen Newport, MD 07/25/16 [phone]

## 2016-07-25 NOTE — ED Notes (Signed)
Pt to ed after fall today,  Pt states she tripped walking up on sidewalk,  Pt with large hematoma and bruising to left eye and cheek,  Pt also with small puncture/laceration between the eyes,  Pt with c/o pain to right wrist/arm.  +pulse, movement and sensation in right hand/wrist.  Pt alert and oriented at this time.

## 2016-07-25 NOTE — ED Notes (Signed)
Patient transported to CT 

## 2016-07-25 NOTE — ED Triage Notes (Signed)
Pt to ed via [facility] [employer],  Pt states she was walking up on sidewalk and fell forward.  Pt with hematoma to left eye and small laceration between the eyes, bleeding controlled. Pt alert and oriented and denies loss of consciousness.

## 2016-08-06 ENCOUNTER — Ambulatory Visit
Admission: RE | Admit: 2016-08-06 | Discharge: 2016-08-06 | Disposition: A | Discharge status: Home / Self Care | Payer: Medicare HMO | Source: Ambulatory Visit | Attending: Surgery | Admitting: Surgery

## 2016-08-06 ENCOUNTER — Encounter
Admission: RE | Disposition: A | Discharge status: Home / Self Care | Payer: Self-pay | Source: Ambulatory Visit | Attending: Surgery

## 2016-08-06 ENCOUNTER — Encounter: Payer: Self-pay | Admitting: *Deleted

## 2016-08-06 ENCOUNTER — Ambulatory Visit: Payer: Medicare HMO | Admitting: Anesthesiology

## 2016-08-06 DIAGNOSIS — Z8673 Personal history of transient ischemic attack (TIA), and cerebral infarction without residual deficits: Secondary | ICD-10-CM | POA: Insufficient documentation

## 2016-08-06 DIAGNOSIS — Z7984 Long term (current) use of oral hypoglycemic drugs: Secondary | ICD-10-CM | POA: Diagnosis not present

## 2016-08-06 DIAGNOSIS — E119 Type 2 diabetes mellitus without complications: Secondary | ICD-10-CM | POA: Insufficient documentation

## 2016-08-06 DIAGNOSIS — E785 Hyperlipidemia, unspecified: Secondary | ICD-10-CM | POA: Insufficient documentation

## 2016-08-06 DIAGNOSIS — Z7982 Long term (current) use of aspirin: Secondary | ICD-10-CM | POA: Diagnosis not present

## 2016-08-06 DIAGNOSIS — S52551A Other extraarticular fracture of lower end of right radius, initial encounter for closed fracture: Secondary | ICD-10-CM | POA: Diagnosis present

## 2016-08-06 DIAGNOSIS — J449 Chronic obstructive pulmonary disease, unspecified: Secondary | ICD-10-CM | POA: Diagnosis not present

## 2016-08-06 DIAGNOSIS — I10 Essential (primary) hypertension: Secondary | ICD-10-CM | POA: Insufficient documentation

## 2016-08-06 DIAGNOSIS — M170 Bilateral primary osteoarthritis of knee: Secondary | ICD-10-CM | POA: Diagnosis not present

## 2016-08-06 DIAGNOSIS — W010XXA Fall on same level from slipping, tripping and stumbling without subsequent striking against object, initial encounter: Secondary | ICD-10-CM | POA: Diagnosis not present

## 2016-08-06 DIAGNOSIS — K219 Gastro-esophageal reflux disease without esophagitis: Secondary | ICD-10-CM | POA: Diagnosis not present

## 2016-08-06 DIAGNOSIS — F419 Anxiety disorder, unspecified: Secondary | ICD-10-CM | POA: Insufficient documentation

## 2016-08-06 DIAGNOSIS — S52691A Other fracture of lower end of right ulna, initial encounter for closed fracture: Secondary | ICD-10-CM | POA: Diagnosis not present

## 2016-08-06 DIAGNOSIS — Y92481 Parking lot as the place of occurrence of the external cause: Secondary | ICD-10-CM | POA: Diagnosis not present

## 2016-08-06 DIAGNOSIS — I2510 Atherosclerotic heart disease of native coronary artery without angina pectoris: Secondary | ICD-10-CM | POA: Diagnosis not present

## 2016-08-06 DIAGNOSIS — Z951 Presence of aortocoronary bypass graft: Secondary | ICD-10-CM | POA: Diagnosis not present

## 2016-08-06 DIAGNOSIS — F329 Major depressive disorder, single episode, unspecified: Secondary | ICD-10-CM | POA: Diagnosis not present

## 2016-08-06 HISTORY — PX: OPEN REDUCTION INTERNAL FIXATION (ORIF) DISTAL RADIAL FRACTURE: SHX5989

## 2016-08-06 LAB — GLUCOSE, CAPILLARY
Glucose-Capillary: 95 mg/dL (ref 65–99)
Glucose-Capillary: 139 mg/dL — ABNORMAL HIGH (ref 65–99)

## 2016-08-06 SURGERY — OPEN REDUCTION INTERNAL FIXATION (ORIF) DISTAL RADIUS FRACTURE
Anesthesia: General | Laterality: Right | Wound class: Clean

## 2016-08-06 MED ORDER — OXYCODONE HCL 5 MG PO TABS
ORAL_TABLET | ORAL | Status: AC
  Filled 2016-08-06: qty 1

## 2016-08-06 MED ORDER — OXYCODONE HCL 5 MG PO TABS
5.0000 mg | ORAL_TABLET | Freq: Four times a day (QID) | ORAL | Status: DC | PRN
Start: 2016-08-06 — End: 2016-08-06
  Administered 2016-08-06: 5 mg via ORAL
  Filled 2016-08-06: qty 1

## 2016-08-06 MED ORDER — BUPIVACAINE HCL (PF) 0.5 % IJ SOLN
INTRAMUSCULAR | Status: AC
  Filled 2016-08-06: qty 30

## 2016-08-06 MED ORDER — CEFAZOLIN SODIUM-DEXTROSE 2-4 GM/100ML-% IV SOLN
2.0000 g | Freq: Once | INTRAVENOUS | Status: AC
  Administered 2016-08-06: 2 g via INTRAVENOUS

## 2016-08-06 MED ORDER — NEOMYCIN-POLYMYXIN B GU 40-200000 IR SOLN
Status: DC | PRN
  Administered 2016-08-06: 2 mL

## 2016-08-06 MED ORDER — BUPIVACAINE HCL (PF) 0.5 % IJ SOLN
INTRAMUSCULAR | Status: DC | PRN
  Administered 2016-08-06: 10 mL

## 2016-08-06 MED ORDER — MIDAZOLAM HCL 2 MG/2ML IJ SOLN
INTRAMUSCULAR | Status: AC
  Filled 2016-08-06: qty 2

## 2016-08-06 MED ORDER — DEXTROSE 5 % IV SOLN
INTRAVENOUS | Status: DC | PRN
  Administered 2016-08-06: 14:00:00 via INTRAVENOUS

## 2016-08-06 MED ORDER — HYDROMORPHONE HCL 1 MG/ML IJ SOLN
0.5000 mg | INTRAMUSCULAR | Status: AC | PRN
  Administered 2016-08-06 (×4): 0.5 mg via INTRAVENOUS

## 2016-08-06 MED ORDER — FAMOTIDINE 20 MG PO TABS
ORAL_TABLET | ORAL | Status: AC
  Filled 2016-08-06: qty 1

## 2016-08-06 MED ORDER — ONDANSETRON HCL 4 MG/2ML IJ SOLN
4.0000 mg | Freq: Once | INTRAMUSCULAR | Status: AC | PRN
  Administered 2016-08-06: 4 mg via INTRAVENOUS

## 2016-08-06 MED ORDER — HYDRALAZINE HCL 20 MG/ML IJ SOLN
INTRAMUSCULAR | Status: AC
  Filled 2016-08-06: qty 1

## 2016-08-06 MED ORDER — FENTANYL CITRATE (PF) 100 MCG/2ML IJ SOLN
25.0000 ug | INTRAMUSCULAR | Status: DC | PRN
  Administered 2016-08-06 (×4): 25 ug via INTRAVENOUS

## 2016-08-06 MED ORDER — PHENYLEPHRINE HCL 10 MG/ML IJ SOLN
INTRAMUSCULAR | Status: AC
  Filled 2016-08-06: qty 1

## 2016-08-06 MED ORDER — NEOMYCIN-POLYMYXIN B GU 40-200000 IR SOLN
Status: AC
  Filled 2016-08-06: qty 2

## 2016-08-06 MED ORDER — FENTANYL CITRATE (PF) 100 MCG/2ML IJ SOLN
INTRAMUSCULAR | Status: AC
  Filled 2016-08-06: qty 2

## 2016-08-06 MED ORDER — HYDROMORPHONE HCL 1 MG/ML IJ SOLN
INTRAMUSCULAR | Status: AC
  Filled 2016-08-06: qty 1

## 2016-08-06 MED ORDER — FENTANYL CITRATE (PF) 100 MCG/2ML IJ SOLN
INTRAMUSCULAR | Status: DC | PRN
  Administered 2016-08-06: 50 ug via INTRAVENOUS
  Administered 2016-08-06 (×2): 25 ug via INTRAVENOUS
  Administered 2016-08-06 (×2): 50 ug via INTRAVENOUS

## 2016-08-06 MED ORDER — OXYCODONE HCL 5 MG PO TABS: 5.0000 mg | ORAL_TABLET | ORAL | 0 refills | Status: DC | PRN

## 2016-08-06 MED ORDER — CEFAZOLIN SODIUM-DEXTROSE 2-4 GM/100ML-% IV SOLN
INTRAVENOUS | Status: AC
  Filled 2016-08-06: qty 100

## 2016-08-06 MED ORDER — SODIUM CHLORIDE 0.9 % IV SOLN
INTRAVENOUS | Status: DC
  Administered 2016-08-06 (×2): via INTRAVENOUS

## 2016-08-06 MED ORDER — MIDAZOLAM HCL 2 MG/2ML IJ SOLN
INTRAMUSCULAR | Status: DC | PRN
Start: 2016-08-06 — End: 2016-08-06
  Administered 2016-08-06 (×2): 1 mg via INTRAVENOUS

## 2016-08-06 MED ORDER — PROPOFOL 10 MG/ML IV BOLUS
INTRAVENOUS | Status: DC | PRN
  Administered 2016-08-06: 100 mg via INTRAVENOUS

## 2016-08-06 MED ORDER — HYDRALAZINE HCL 20 MG/ML IJ SOLN
INTRAMUSCULAR | Status: DC | PRN
  Administered 2016-08-06 (×2): 5 mg via INTRAVENOUS

## 2016-08-06 MED ORDER — FAMOTIDINE 20 MG PO TABS: 20.0000 mg | ORAL_TABLET | Freq: Once | ORAL | Status: DC

## 2016-08-06 MED ORDER — PROPOFOL 10 MG/ML IV BOLUS
INTRAVENOUS | Status: AC
  Filled 2016-08-06: qty 20

## 2016-08-06 SURGICAL SUPPLY — 59 items
BANDAGE ELASTIC 3 LF NS (GAUZE/BANDAGES/DRESSINGS) ×3 IMPLANT
BANDAGE ELASTIC 4 LF NS (GAUZE/BANDAGES/DRESSINGS) IMPLANT
BIT DRILL 2.2 SS TIBIAL (BIT) ×3 IMPLANT
BNDG COHESIVE 4X5 TAN STRL (GAUZE/BANDAGES/DRESSINGS) ×3 IMPLANT
BNDG ESMARK 4X12 TAN STRL LF (GAUZE/BANDAGES/DRESSINGS) ×3 IMPLANT
CANISTER SUCT 1200ML W/VALVE (MISCELLANEOUS) ×3 IMPLANT
CAST PADDING 3X4FT ST 30246 (SOFTGOODS) ×2
CHLORAPREP W/TINT 26ML (MISCELLANEOUS) ×6 IMPLANT
CLOSURE WOUND 1/4X4 (GAUZE/BANDAGES/DRESSINGS) ×1
CORD BIP STRL DISP 12FT (MISCELLANEOUS) ×3 IMPLANT
CUFF TOURN 18 STER (MISCELLANEOUS) ×3 IMPLANT
DRAPE FLUOR MINI C-ARM 54X84 (DRAPES) ×3 IMPLANT
DRAPE IMP U-DRAPE 54X76 (DRAPES) ×3 IMPLANT
DRAPE SURG 17X11 SM STRL (DRAPES) ×3 IMPLANT
DRAPE U-SHAPE 47X51 STRL (DRAPES) ×3 IMPLANT
ELECT CAUTERY BLADE 6.4 (BLADE) ×3 IMPLANT
ELECT REM PT RETURN 9FT ADLT (ELECTROSURGICAL) ×3
ELECTRODE REM PT RTRN 9FT ADLT (ELECTROSURGICAL) ×1 IMPLANT
FORCEPS JEWEL BIP 4-3/4 STR (INSTRUMENTS) ×3 IMPLANT
GAUZE PETRO XEROFOAM 1X8 (MISCELLANEOUS) IMPLANT
GAUZE SPONGE 4X4 12PLY STRL (GAUZE/BANDAGES/DRESSINGS) ×3 IMPLANT
GLOVE BIO SURGEON STRL SZ8 (GLOVE) ×3 IMPLANT
GLOVE INDICATOR 8.0 STRL GRN (GLOVE) ×3 IMPLANT
GOWN STRL REUS W/ TWL LRG LVL3 (GOWN DISPOSABLE) ×1 IMPLANT
GOWN STRL REUS W/ TWL XL LVL3 (GOWN DISPOSABLE) ×1 IMPLANT
GOWN STRL REUS W/TWL LRG LVL3 (GOWN DISPOSABLE) ×2
GOWN STRL REUS W/TWL XL LVL3 (GOWN DISPOSABLE) ×2
K-WIRE 1.6 (WIRE) ×4
K-WIRE FX5X1.6XNS BN SS (WIRE) ×2
KIT RM TURNOVER STRD PROC AR (KITS) ×3 IMPLANT
KWIRE FX5X1.6XNS BN SS (WIRE) ×2 IMPLANT
NEEDLE FILTER BLUNT 18X 1/2SAF (NEEDLE) ×2
NEEDLE FILTER BLUNT 18X1 1/2 (NEEDLE) ×1 IMPLANT
NS IRRIG 500ML POUR BTL (IV SOLUTION) ×3 IMPLANT
PACK EXTREMITY ARMC (MISCELLANEOUS) ×3 IMPLANT
PAD CAST CTTN 3X4 STRL (SOFTGOODS) ×1 IMPLANT
PAD CAST CTTN 4X4 STRL (SOFTGOODS) IMPLANT
PADDING CAST COTTON 4X4 STRL (SOFTGOODS)
PEG LOCKING SMOOTH 2.2X18 (Peg) ×6 IMPLANT
PEG LOCKING SMOOTH 2.2X20 (Screw) ×9 IMPLANT
PLATE CROSSLOCK NAR MINI RT (Plate) ×3 IMPLANT
SCREW  LP NL 2.7X15MM (Screw) ×2 IMPLANT
SCREW 2.7X14MM (Screw) ×2 IMPLANT
SCREW BN 14X2.7XNONLOCK 3 LD (Screw) ×1 IMPLANT
SCREW LOCKING 2.7X15MM (Screw) ×3 IMPLANT
SCREW LP NL 2.7X15MM (Screw) ×1 IMPLANT
SCREW LP NL 2.7X22MM (Screw) ×3 IMPLANT
SCREW NONLOCK 2.7X18MM (Screw) ×3 IMPLANT
SPLINT CAST 1 STEP 3X12 (MISCELLANEOUS) ×3 IMPLANT
STAPLER SKIN PROX 35W (STAPLE) ×3 IMPLANT
STOCKINETTE IMPERVIOUS 9X36 MD (GAUZE/BANDAGES/DRESSINGS) ×3 IMPLANT
STRIP CLOSURE SKIN 1/4X4 (GAUZE/BANDAGES/DRESSINGS) ×2 IMPLANT
SUT PROLENE 4 0 PS 2 18 (SUTURE) IMPLANT
SUT VIC AB 2-0 SH 27 (SUTURE) ×2
SUT VIC AB 2-0 SH 27XBRD (SUTURE) ×1 IMPLANT
SUT VIC AB 3-0 SH 27 (SUTURE) ×2
SUT VIC AB 3-0 SH 27X BRD (SUTURE) ×1 IMPLANT
SWABSTK COMLB BENZOIN TINCTURE (MISCELLANEOUS) ×3 IMPLANT
SYRINGE 10CC LL (SYRINGE) ×3 IMPLANT

## 2016-08-06 NOTE — Anesthesia Preprocedure Evaluation (Signed)
Anesthesia Evaluation  Patient identified by MRN, date of birth, ID band Patient awake    Reviewed: Allergy & Precautions, H&P , NPO status , Patient's Chart, lab work & pertinent test results, reviewed documented beta blocker date and time   Airway Mallampati: II  TM Distance: >3 FB Neck ROM: full    Dental  (+) Teeth Intact   Pulmonary neg pulmonary ROS, COPD, Current Smoker,    Pulmonary exam normal        Cardiovascular Exercise Tolerance: Good hypertension, negative cardio ROS Normal cardiovascular exam Rate:Normal     Neuro/Psych CVA negative neurological ROS  negative psych ROS   GI/Hepatic negative GI ROS, Neg liver ROS, GERD  Medicated,  Endo/Other  negative endocrine ROSdiabetes  Renal/GU negative Renal ROS  negative genitourinary   Musculoskeletal  (+) Arthritis ,   Abdominal   Peds  Hematology negative hematology ROS (+)   Anesthesia Other Findings   Reproductive/Obstetrics negative OB ROS                             Anesthesia Physical Anesthesia Plan  ASA: III and emergent  Anesthesia Plan: General LMA   Post-op Pain Management:    Induction:   Airway Management Planned: LMA  Additional Equipment:   Intra-op Plan:   Post-operative Plan:   Informed Consent: I have reviewed the patients History and Physical, chart, labs and discussed the procedure including the risks, benefits and alternatives for the proposed anesthesia with the patient or authorized representative who has indicated his/her understanding and acceptance.     Plan Discussed with: CRNA  Anesthesia Plan Comments:         Anesthesia Quick Evaluation

## 2016-08-06 NOTE — Discharge Instructions (Signed)

## 2016-08-06 NOTE — OR Nursing (Signed)
Per dr Andree Elk to hang D5 116mls; patient did not stop her metformin 2 days prior to surgery; FS was 95.  2 bags of D5 (50 ml) were hung; Elta [place] K with verbal agreement on plan.

## 2016-08-06 NOTE — OR Nursing (Signed)
Prescription for pain medication given to the patients son. Bhavini , rn witnessed.

## 2016-08-06 NOTE — Op Note (Signed)
08/06/2016  4:19 PM  Patient:   Jody Stewart  Pre-Op Diagnosis:   Closed acute extra-articular distal radius fracture with distal ulna fracture, right wrist.  Post-Op Diagnosis:   Same.  Procedure:   Open reduction and internal fixation of right distal radius fracture.  Surgeon:   Pascal Lux, MD  Assistant:   None  Anesthesia:   General LMA  Findings:   As above.  Complications:   None  EBL:   5 cc  Fluids:   600 cc crystalloid  TT:   75 minutes at 250 mmHg  Drains:   None  Closure:   3-0 Vicryl subcuticular sutures  Implants:   Biomet DVR Cross-locked narrow precontoured distal radius mini-locking plate.  Brief Clinical Note:   The patient is a 71 year old female who is staying the above-noted injury 10 days ago when she tripped and fell onto her outstretched right hand. Initially, the fracture was going to be managed nonoperatively, but she fell again and displaced the fracture. She presents at this time for definitive management of her injury.  Procedure:   The patient was brought into the operating room and lain in the supine position. After adequate general laryngal mask anesthesia was obtained, the patient's right hand and upper extremity were prepped with ChloraPrep solution before being draped sterilely. Preoperative antibiotics were administered. A timeout was performed to verify the appropriate surgical site before the limb was exsanguinated with an Esmarch and the tourniquet inflated to 250 mmHg. An approximately 7-8 cm incision was made over the volar aspect of the distal radius beginning at the volar flexion crease and extending proximally along the flexor carpi radialis tendon. The incision was carried down through the subcutaneous tissues to expose the superficial retinaculum. This was split the length of the incision directly over the flexor carpi radialis tendon. The FCR sheath was opened and the tendon retracted ulnarly to protect the median nerve. The  floor of the FCR sheath was opened to expose the pronator quadratus muscle. This was released along the radial insertion and the muscle was retracted ulnarly to expose the distal radius. The fracture was identified and soft tissues elevated off the distal metaphyseal region for several centimeters. The appropriate sized plate was selected and positioned on the distal radius. A guidewire was placed through the distal hole and its position verified using FluoroScan imaging in AP and lateral projections. After several attempts, the pin was positioned parallel to the distal articular surface and approximately 3-4 mm proximal to the articular surface. The plate was carefully lowered onto the volar metaphyseal surface, reducing the fracture in the process. Again the position of the plate was verified using FluoroScan imaging in AP and lateral projections and found to be excellent. The plate was secured using a nonlocking bicortical screw proximally. Distally, a nonlocking cortical screw was placed in the ulnar most hole of the more proximal row. The other holes were filled with locking pegs of the appropriate lengths. The adequacy of hardware position and fracture reduction was verified using FluoroScan imaging in AP, lateral, and several additional oblique projections to be sure that the hardware did not enter the joint nor did it penetrate dorsally. One additional bicortical nonlocking screw was placed proximally followed by a locking cortical screw to secure the plate to the metaphyseal region proximally. Again the construct was assessed using FluoroScan imaging in AP, lateral, and oblique projections and found to be excellent.  The wound was copiously irrigated with sterile saline solution before the  pronator quadratus was reapproximated using 2-0 Vicryl interrupted sutures. The subcutaneous tissues were closed using 2-0 Vicryl interrupted sutures before the subcuticular layer was closed using 3-0 Vicryl inverted  interrupted sutures. Benzoin and Steri-Strips are applied to the skin. A total of 10 cc of 0.5% plain Sensorcaine was injected in and around the incision site to help with postoperative analgesia before a sterile bulky dressing was applied to the wound. The patient was placed into a volar splint maintaining the wrist in neutral position before the patient was awakened, extubated, and returned to the recovery room in satisfactory condition after tolerating the procedure well.

## 2016-08-06 NOTE — Transfer of Care (Signed)
Immediate Anesthesia Transfer of Care Note  Patient: Jody Stewart  Procedure(s) Performed: Procedure(s): OPEN REDUCTION INTERNAL FIXATION (ORIF) DISTAL RADIAL FRACTURE (Right)  Patient Location: PACU  Anesthesia Type:General  Level of Consciousness: awake and alert   Airway & Oxygen Therapy: Patient Spontanous Breathing and Patient connected to face mask oxygen  Post-op Assessment: Report given to RN and Post -op Vital signs reviewed and stable  Post vital signs: Reviewed and stable  Last Vitals:  Vitals:   08/06/16 1630 08/06/16 1635  BP:    Pulse: 69 66  Resp: (!) 21 18  Temp:      Last Pain:  Vitals:   08/06/16 1635  TempSrc:   PainSc: 6          Complications: No apparent anesthesia complications

## 2016-08-06 NOTE — Anesthesia Postprocedure Evaluation (Signed)
Anesthesia Post Note  Patient: Jody Stewart  Procedure(s) Performed: Procedure(s) (LRB): OPEN REDUCTION INTERNAL FIXATION (ORIF) DISTAL RADIAL FRACTURE (Right)  Patient location during evaluation: PACU Anesthesia Type: General Level of consciousness: awake and alert and oriented Pain management: pain level controlled Vital Signs Assessment: post-procedure vital signs reviewed and stable Respiratory status: spontaneous breathing, nonlabored ventilation and respiratory function stable Cardiovascular status: blood pressure returned to baseline and stable Postop Assessment: no signs of nausea or vomiting Anesthetic complications: no     Last Vitals:  Vitals:   08/06/16 1732 08/06/16 1832  BP: (!) 151/47 (!) 153/46  Pulse: 64 65  Resp: 16 16  Temp: 36.1 C     Last Pain:  Vitals:   08/06/16 1832  TempSrc:   PainSc: 4                  Tamya Denardo

## 2016-08-06 NOTE — Anesthesia Post-op Follow-up Note (Signed)
Anesthesia QCDR form completed.        

## 2016-08-06 NOTE — H&P (Signed)
Paper H&P to be scanned into permanent record. H&P reviewed. No changes. 

## 2016-08-07 ENCOUNTER — Encounter: Payer: Self-pay | Admitting: Surgery

## 2016-08-10 LAB — METHYLMALONIC ACID, SERUM: Methylmalonic Acid, Quantitative: 290 nmol/L (ref 0–378)

## 2017-03-17 IMAGING — CT CT HEAD W/O CM
3 of 6 series · 15 of 47 positions shown, 18 images · non-contrast
Comparison: None.

CLINICAL DATA: 70-year-old female with facial and head pain and
swelling following fall and head/face injury today. Initial
encounter.

EXAM:
CT HEAD WITHOUT CONTRAST
CT MAXILLOFACIAL WITHOUT CONTRAST
TECHNIQUE: Multidetector CT imaging of the head and maxillofacial structures
were performed using the standard protocol without intravenous
contrast. Multiplanar CT image reconstructions of the maxillofacial
structures were also generated.

[Series 8: coronal soft tissue · coronal · 0.27mm/px · 3 of 68 slices shown]
[im 25/68  brain]
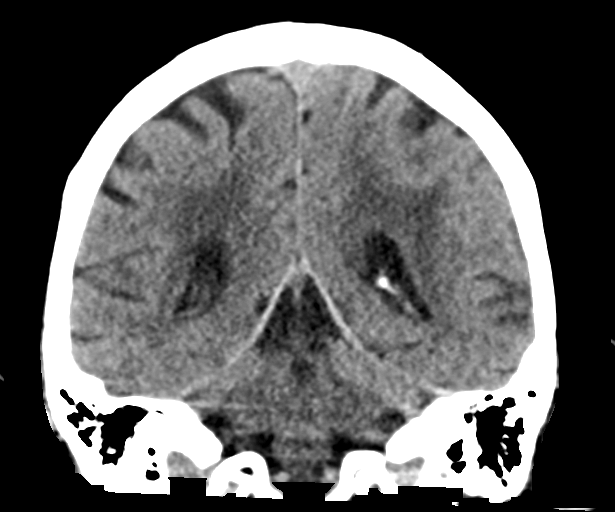
[im 37/68  brain]
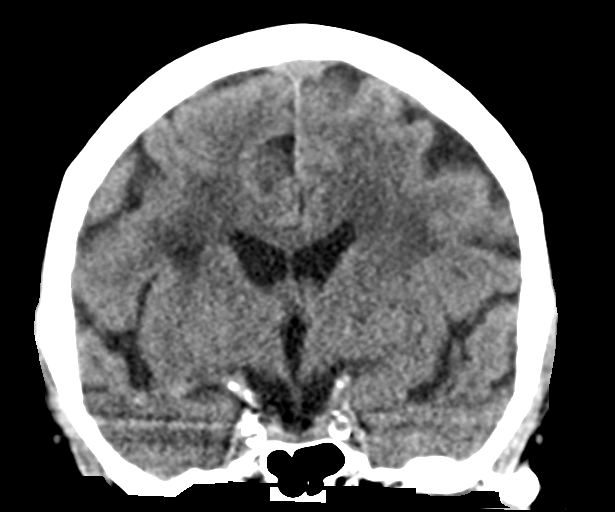
[im 49/68  brain]
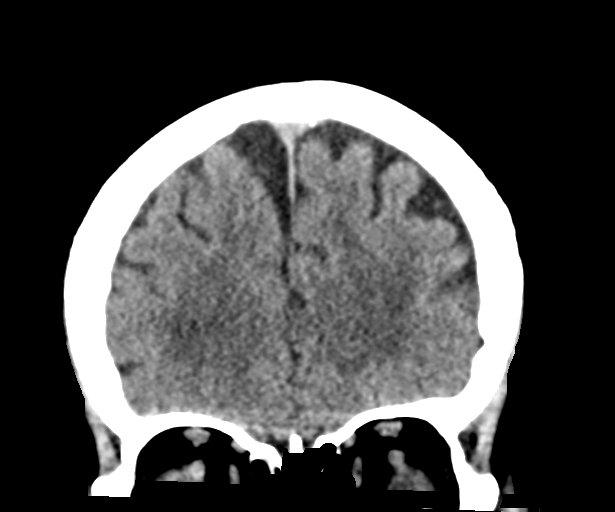

[Series 11: sagittal soft · sagittal · 0.27mm/px · 2 of 84 slices shown]
[im 28/84  brain]
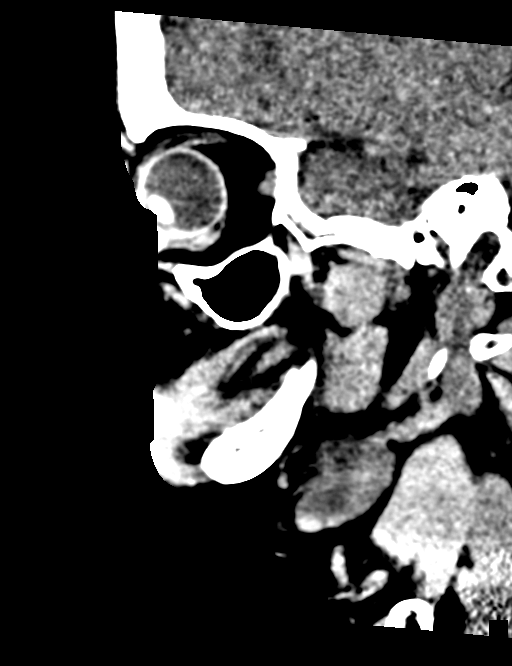
[im 56/84  brain]
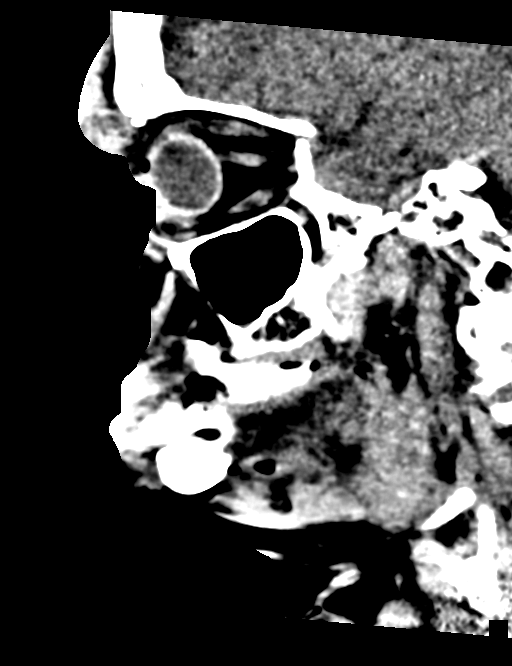

[Series 14: max soft 2 · axial · 0.35mm/px · z∈[-214,-82]mm · 10 of 78 slices shown, 13 images]
[im 8/78  brain]
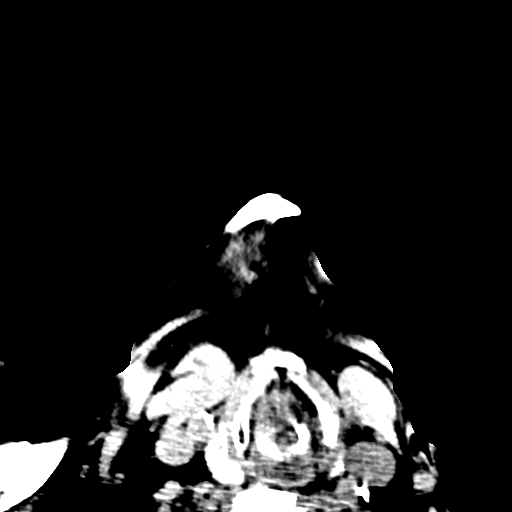
[im 8/78  bone]
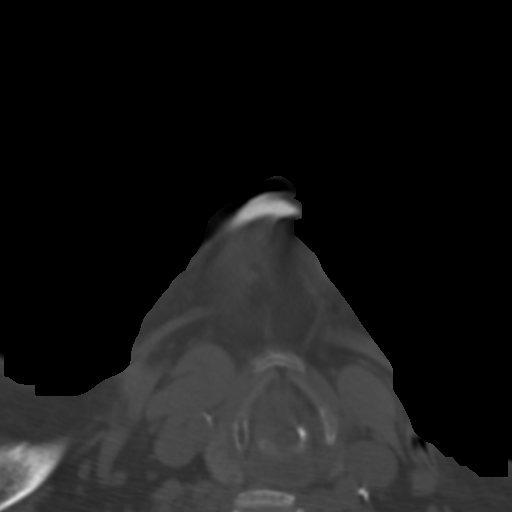
[im 15/78  brain]
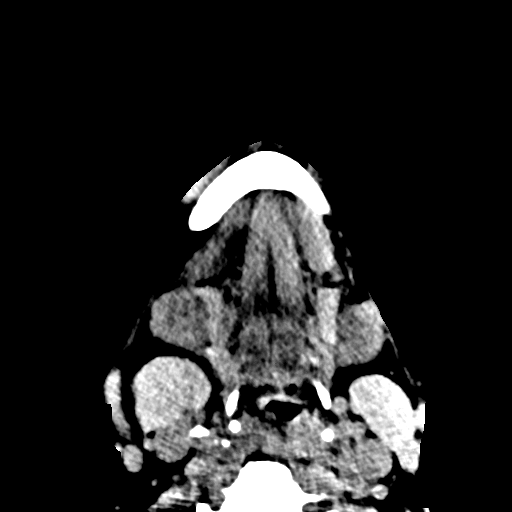
[im 23/78  brain]
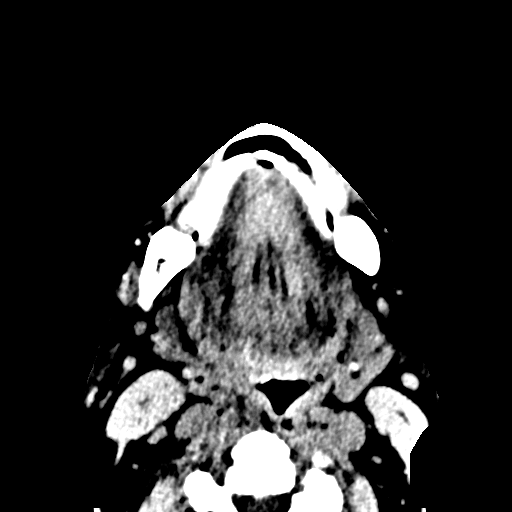
[im 30/78  brain]
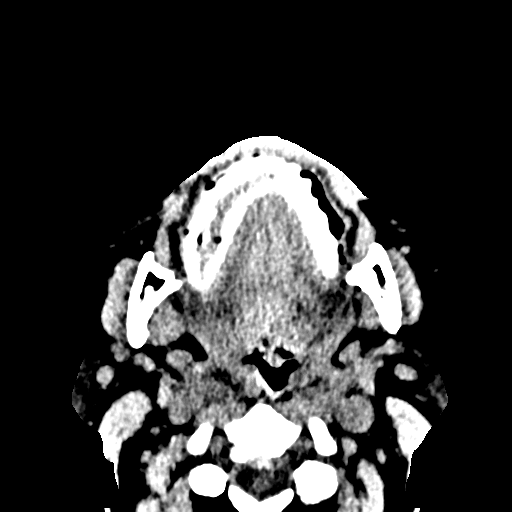
[im 37/78  brain]
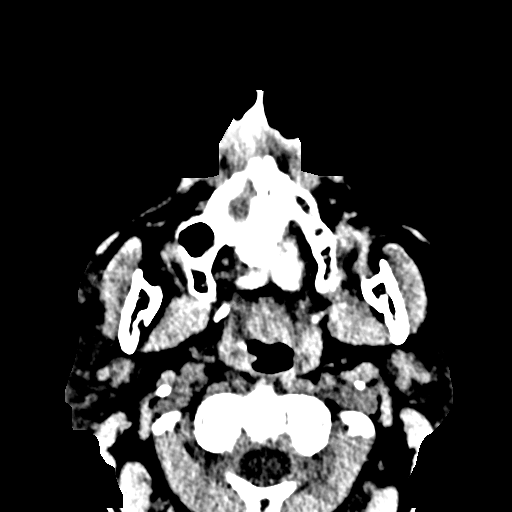
[im 37/78  bone]
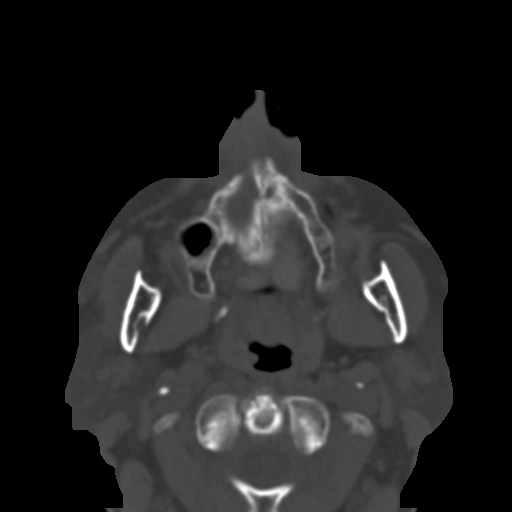
[im 45/78  brain]
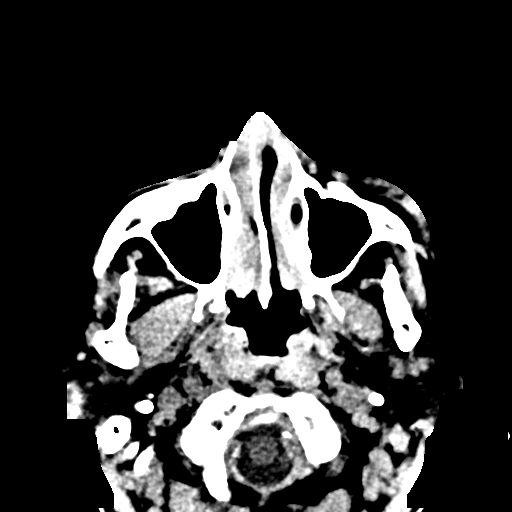
[im 52/78  brain]
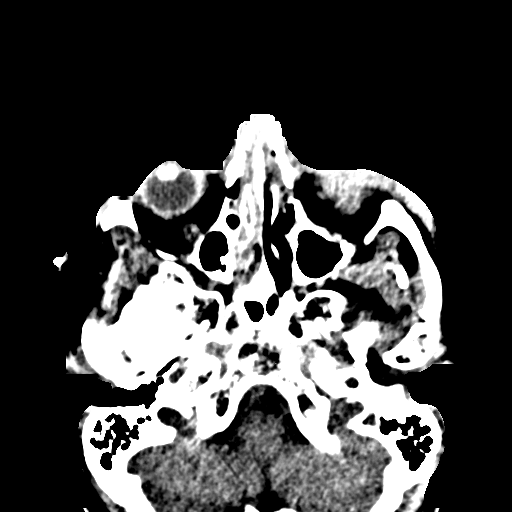
[im 59/78  brain]
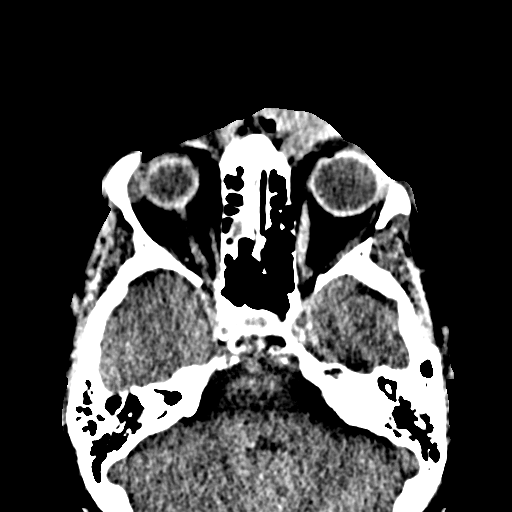
[im 67/78  brain]
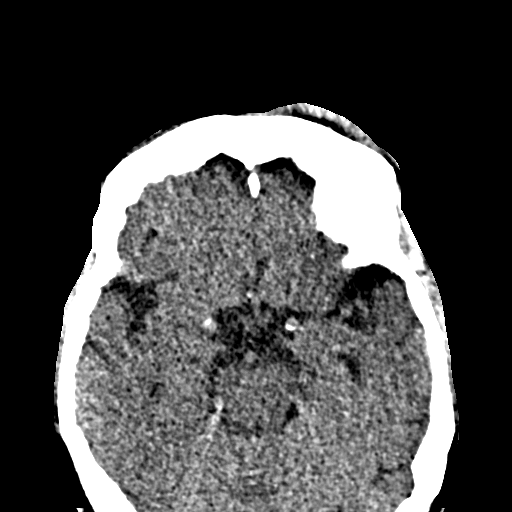
[im 67/78  bone]
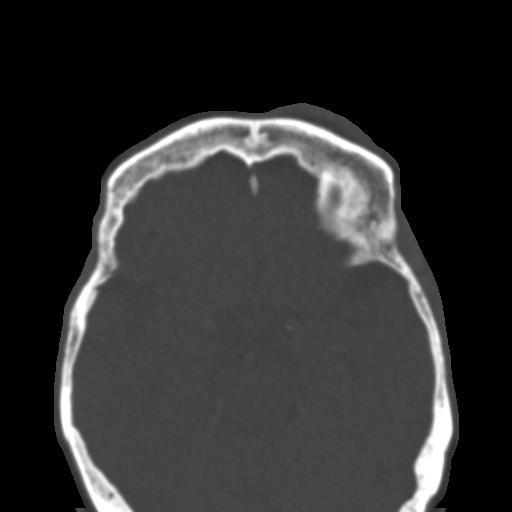
[im 74/78  brain]
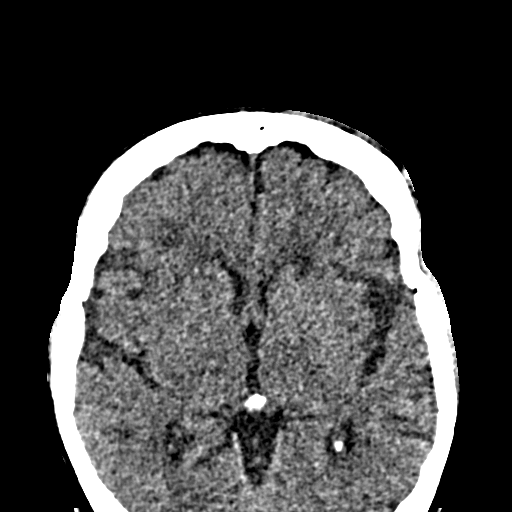

[15 of 47 positions shown; findings below may reference images not displayed]

FINDINGS: CT HEAD FINDINGS

Brain: No evidence of acute infarction, hemorrhage, hydrocephalus,
extra-axial collection or mass lesion/mass effect.

Atrophy and chronic small-vessel white matter ischemic changes are
noted. Remote infarcts in the left basal ganglia identified.

Vascular: Intracranial atherosclerotic calcifications noted.

Skull: Normal. Negative for fracture or focal lesion.

Other: Left forehead soft tissue swelling/hematoma noted.

CT MAXILLOFACIAL FINDINGS

Osseous: Bilateral nasal fractures are identified, with the left
nasal fracture displaced by 2 mm. No other fracture, subluxation or
dislocation identified.

Orbits: Negative. No traumatic or inflammatory finding.

Sinuses: Clear.

Soft tissues:  Left preseptal facial soft tissue swelling noted.
IMPRESSION: Bilateral nasal bone fractures. The left nasal fracture is displaced
by 2 mm. Left facial and forehead soft tissue swelling.

No evidence of acute intracranial abnormality.

Atrophy, chronic small-vessel white matter ischemic changes and
remote left basal ganglia infarcts.

## 2017-09-20 ENCOUNTER — Other Ambulatory Visit: Payer: Self-pay | Admitting: Family

## 2017-09-20 DIAGNOSIS — Z1231 Encounter for screening mammogram for malignant neoplasm of breast: Secondary | ICD-10-CM

## 2017-09-22 ENCOUNTER — Ambulatory Visit
Admission: RE | Admit: 2017-09-22 | Discharge: 2017-09-22 | Disposition: A | Discharge status: Home / Self Care | Payer: Medicare HMO | Source: Ambulatory Visit | Attending: Family | Admitting: Family

## 2017-09-22 DIAGNOSIS — Z1231 Encounter for screening mammogram for malignant neoplasm of breast: Secondary | ICD-10-CM | POA: Insufficient documentation

## 2017-09-22 HISTORY — DX: Malignant (primary) neoplasm, unspecified: C80.1

## 2017-10-08 ENCOUNTER — Inpatient Hospital Stay
Admission: RE | Admit: 2017-10-08 | Discharge: 2017-10-08 | Disposition: A | Discharge status: Home / Self Care | Payer: Self-pay | Source: Ambulatory Visit | Attending: *Deleted | Admitting: *Deleted

## 2017-10-08 ENCOUNTER — Other Ambulatory Visit: Payer: Self-pay | Admitting: *Deleted

## 2017-10-08 DIAGNOSIS — Z9289 Personal history of other medical treatment: Secondary | ICD-10-CM

## 2018-09-13 ENCOUNTER — Other Ambulatory Visit: Payer: Self-pay | Admitting: Family

## 2018-09-13 DIAGNOSIS — Z1231 Encounter for screening mammogram for malignant neoplasm of breast: Secondary | ICD-10-CM

## 2018-09-26 ENCOUNTER — Ambulatory Visit: Payer: Medicare HMO

## 2018-11-03 ENCOUNTER — Ambulatory Visit: Payer: Medicare HMO

## 2019-04-30 ENCOUNTER — Encounter: Payer: Self-pay | Admitting: Emergency Medicine

## 2019-04-30 ENCOUNTER — Other Ambulatory Visit: Payer: Self-pay

## 2019-04-30 ENCOUNTER — Ambulatory Visit
Admission: EM | Admit: 2019-04-30 | Discharge: 2019-04-30 | Disposition: A | Discharge status: Home / Self Care | Payer: Medicare HMO | Attending: Urgent Care | Admitting: Urgent Care

## 2019-04-30 DIAGNOSIS — L02214 Cutaneous abscess of groin: Secondary | ICD-10-CM

## 2019-04-30 MED ORDER — LIDOCAINE HCL (PF) 1 % IJ SOLN: 5.0000 mL | Freq: Once | INTRAMUSCULAR | Status: DC

## 2019-04-30 MED ORDER — SULFAMETHOXAZOLE-TRIMETHOPRIM 800-160 MG PO TABS: 1.0000 | ORAL_TABLET | Freq: Two times a day (BID) | ORAL | 0 refills | Status: AC

## 2019-04-30 MED ORDER — MUPIROCIN CALCIUM 2 % EX CREA: TOPICAL_CREAM | CUTANEOUS | 0 refills | Status: AC

## 2019-04-30 NOTE — ED Provider Notes (Signed)
[place]   Name: Jody Stewart DOB: March 03, 1946 MRN: [id number] CSN: [id number] PCP: Verita Lamb, NP  Arrival date and time:  04/30/19 1416  Chief Complaint:  Abscess   NOTE: Prior to seeing the patient today, I have reviewed the triage nursing documentation and vital signs. Clinical staff has updated patient's PMH/PSHx, current medication list, and drug allergies/intolerances to ensure comprehensive history available to assist in medical decision making.   History:   HPI: Jody Stewart is a 73 y.o. female who presents today with complaints of an abscess that declared in her groin areas approximately 2 weeks ago. Area is painful, swollen, and warm to the touch. Abscess has been draining yellow/green material that has an associated foul odor. Drainage has increased over the course of the last week. Patient denies any systemic signs of infection; no fevers, chills, nausea, or vomiting. She notes that she does not have a history of recurrent skin abscesses.   Past Medical History:  Diagnosis Date  . Arthritis   . Cancer ([place])    skin ca  . COPD (chronic obstructive pulmonary disease) ([place])   . Diabetes mellitus without complication ([place])   . Edema   . GERD (gastroesophageal reflux disease)   . Hypertension   . Palpitations   . Stroke [facility]) 2004   tia    Past Surgical History:  Procedure Laterality Date  . ABDOMINAL HYSTERECTOMY    . CATARACT EXTRACTION W/PHACO Left 05/30/2015   Procedure: CATARACT EXTRACTION PHACO AND INTRAOCULAR LENS PLACEMENT (IOC);  Surgeon: Lyla Glassing, MD;  Location: [facility] ORS;  Service: Ophthalmology;  Laterality: Left;  [place]    1:46.3 AP      11.5 CDE    12.15 casette lot [id number]  . CORONARY ARTERY BYPASS GRAFT  2014  . JOINT REPLACEMENT  [id number]   bil tkr  . OPEN REDUCTION INTERNAL FIXATION (ORIF) DISTAL RADIAL FRACTURE Right 08/06/2016   Procedure: OPEN REDUCTION INTERNAL FIXATION (ORIF) DISTAL RADIAL FRACTURE;  Surgeon: Corky Mull,  MD;  Location: [facility] ORS;  Service: Orthopedics;  Laterality: Right;    Family History  Problem Relation Age of Onset  . Breast cancer Neg Hx     Social History   Tobacco Use  . Smoking status: Current Every Day Smoker    Packs/day: 0.50    Types: Cigarettes  . Smokeless tobacco: Never Used  Substance Use Topics  . Alcohol use: No    Alcohol/week: 0.0 standard drinks  . Drug use: No    There are no active problems to display for this patient.   Home Medications:    Current Meds  Medication Sig  . acetaminophen (TYLENOL) 650 MG CR tablet Take 650 mg by mouth every 8 (eight) hours as needed for pain.  Marland Kitchen albuterol (PROVENTIL HFA;VENTOLIN HFA) 108 (90 BASE) MCG/ACT inhaler Inhale 1-2 puffs into the lungs every 6 (six) hours as needed for wheezing or shortness of breath.   Marland Kitchen amLODipine (NORVASC) 5 MG tablet Take 5 mg by mouth daily.  Marland Kitchen aspirin EC 81 MG tablet Take 81 mg by mouth daily.  . furosemide (LASIX) 20 MG tablet Take 20 mg by mouth daily.  Marland Kitchen lisinopril-hydrochlorothiazide (PRINZIDE,ZESTORETIC) 10-12.5 MG tablet Take 1 tablet by mouth daily.  . metFORMIN (GLUCOPHAGE) 500 MG tablet Take 500 mg by mouth 2 (two) times daily.   . rosuvastatin (CRESTOR) 40 MG tablet Take 40 mg by mouth daily.  . sertraline (ZOLOFT) 50 MG tablet Take 50 mg by mouth daily.  Allergies:   Patient has no known allergies.  Review of Systems (ROS): Review of Systems  Constitutional: Negative for chills and fever.  Respiratory: Negative for cough and shortness of breath.   Cardiovascular: Negative for chest pain and palpitations.  Gastrointestinal: Negative for nausea and vomiting.  Skin: Positive for color change (erythema).       Skin abscess  Neurological: Negative for dizziness and headaches.  Hematological: Negative for adenopathy.  All other systems reviewed and are negative.    Vital Signs: Today's Vitals   04/30/19 1430 04/30/19 1431 04/30/19 1435  BP:   125/80  Pulse:   74   Resp:   18  Temp:   98.7 F (37.1 C)  TempSrc:   Oral  SpO2:   98%  Weight:  155 lb (70.3 kg)   Height:  4\' 11"  (1.499 m)   PainSc: 2       Physical Exam: Physical Exam  Constitutional: She is oriented to person, place, and time and well-developed, well-nourished, and in no distress.  HENT:  Head: Normocephalic and atraumatic.  Mouth/Throat: Mucous membranes are normal.  Eyes: Pupils are equal, round, and reactive to light.  Neck: Normal range of motion. Neck supple.  Cardiovascular: Normal rate and intact distal pulses.  Pulmonary/Chest: Effort normal and breath sounds normal. No respiratory distress.  Lymphadenopathy:    She has no cervical adenopathy.  Neurological: She is alert and oriented to person, place, and time. Gait normal.  Skin: Skin is warm and dry. Lesion noted. No rash noted. There is erythema.     Psychiatric: Mood, memory, affect and judgment normal.  Nursing note and vitals reviewed.   Urgent Care Treatments / Results:   LABS: PLEASE NOTE: all labs that were ordered this encounter are listed, however only abnormal results are displayed. Labs Reviewed - No data to display  EKG: -None  RADIOLOGY: No results found.  PROCEDURES: Incision and Drainage  Date/Time: 04/30/2019 3:10 PM Performed by: Karen Kitchens, NP Authorized by: Karen Kitchens, NP   Consent:    Consent obtained:  Verbal   Consent given by:  Patient   Risks discussed:  Bleeding, incomplete drainage, pain, infection and damage to other organs   Alternatives discussed:  No treatment, delayed treatment, alternative treatment, observation and referral Universal protocol:    Procedure explained and questions answered to patient or proxy's satisfaction: yes     Patient identity confirmed:  Verbally with patient Location:    Type:  Abscess   Size:  3 cm   Location: RIGHT groin/suprapubic area. Pre-procedure details:    Skin preparation:  Betadine and antiseptic wash Anesthesia (see  MAR for exact dosages):    Anesthesia method:  Local infiltration   Local anesthetic:  Lidocaine 1% w/o epi Procedure type:    Complexity:  Simple Procedure details:    Incision types:  Single straight   Incision depth:  Subcutaneous   Scalpel blade:  11   Wound management:  Probed and deloculated and irrigated with saline   Drainage:  Bloody and purulent   Drainage amount:  Moderate   Wound treatment:  Wound left open   Packing materials:  None Post-procedure details:    Patient tolerance of procedure:  Tolerated well, no immediate complications   MEDICATIONS RECEIVED THIS VISIT: Medications  lidocaine (PF) (XYLOCAINE) 1 % injection 5 mL (has no administration in time range)    PERTINENT CLINICAL COURSE NOTES/UPDATES:   Initial Impression / Assessment and Plan / Urgent Care  Course:  Pertinent labs & imaging results that were available during my care of the patient were personally reviewed by me and considered in my medical decision making (see lab/imaging section of note for values and interpretations).  Jody Stewart is a 73 y.o. female who [facility] today with complaints of Abscess   Patient is well appearing overall in clinic today. She does not appear to be in any acute distress. Presenting symptoms (see HPI) and exam as documented above. Groin abscess drained as per above procedure noted. Patient tolerated well. She has not experienced any fevers. Area has been draining at home, thus when opened, the majority of the contents were sanguinous with small clots. Wound cavity thoroughly irrigated. Will cover for infection using a  5 day course of SMZ-TMP and topical mupirocin. Patient educated on need for daily wound care. She was encouraged to keep wound clean and dry. She was instructed to apply antibiotic ointment 1-2 times daily. Recommended to cover area for the next few days, as continued drainage is expected. Patient to monitor for signs and symptoms of  infection, which would include increased redness, swelling, streaking, drainage, pain, and the development of a fever. Advised to use warm compresses to help with pain and swelling. May use Tylenol and/or Ibuprofen as needed for pain/fever.   Discussed follow up with primary care physician in 1 week for re-evaluation. I have reviewed the follow up and strict return precautions for any new or worsening symptoms. Patient is aware of symptoms that would be deemed urgent/emergent, and would thus require further evaluation either here or in the emergency department. At the time of discharge, she verbalized understanding and consent with the discharge plan as it was reviewed with her. All questions were fielded by provider and/or clinic staff prior to patient discharge.    Final Clinical Impressions / Urgent Care Diagnoses:   Final diagnoses:  Abscess of groin    New Prescriptions:  [place] Controlled Substance Registry consulted? Not Applicable  Meds ordered this encounter  Medications  . lidocaine (PF) (XYLOCAINE) 1 % injection 5 mL  . mupirocin cream (BACTROBAN) 2 %    Sig: AAA BID until healed.    Dispense:  15 g    Refill:  0  . sulfamethoxazole-trimethoprim (BACTRIM DS) 800-160 MG tablet    Sig: Take 1 tablet by mouth 2 (two) times daily for 5 days.    Dispense:  10 tablet    Refill:  0    Recommended Follow up Care:  Patient encouraged to follow up with the following provider within the specified time frame, or sooner as dictated by the severity of her symptoms. As always, she was instructed that for any urgent/emergent care needs, she should seek care either here or in the emergency department for more immediate evaluation.  Follow-up Information    Verita Lamb, NP In 1 week.   Why: General reassessment of symptoms if not improving Contact information: [address] [place] [address] [phone]         NOTE: This note was prepared using Dragon dictation software along  with smaller phrase technology. Despite my best ability to proofread, there is the potential that transcriptional errors may still occur from this process, and are completely unintentional.    Karen Kitchens, NP 05/01/19 1945

## 2019-04-30 NOTE — Discharge Instructions (Signed)
It was very nice seeing you today in clinic. Thank you for entrusting me with your care.   Keep area clean and dry. Apply topical antibiotics and keep covered. Will also send in oral antibiotics for you to use. Monitor for signs and symptoms of infection, which would include increased redness, swelling, streaking, drainage, pain, and the development of a fever.   Make arrangements to follow up with your regular doctor in 1 week for re-evaluation if not improving. If your symptoms/condition worsens, please seek follow up care either here or in the ER. Please remember, our [place] providers are "right here with you" when you need [place].   Again, it was my pleasure to take care of you today. Thank you for choosing our clinic. I hope that you start to feel better quickly.   Honor Loh, MSN, APRN, FNP-C, CEN Advanced Practice Provider [facility]

## 2019-04-30 NOTE — ED Triage Notes (Signed)
Pt c/o abscess in the vaginal area. Started about 2 weeks ago. She states that it is draining. Denies fever.

## 2021-03-13 ENCOUNTER — Other Ambulatory Visit: Payer: Self-pay

## 2021-03-13 ENCOUNTER — Ambulatory Visit
Admission: EM | Admit: 2021-03-13 | Discharge: 2021-03-13 | Disposition: A | Discharge status: Home / Self Care | Payer: Medicare HMO | Attending: Family Medicine | Admitting: Family Medicine

## 2021-03-13 DIAGNOSIS — M542 Cervicalgia: Secondary | ICD-10-CM | POA: Diagnosis not present

## 2021-03-13 DIAGNOSIS — M545 Low back pain, unspecified: Secondary | ICD-10-CM

## 2021-03-13 MED ORDER — TRAMADOL HCL 50 MG PO TABS: 50.0000 mg | ORAL_TABLET | Freq: Two times a day (BID) | ORAL | 0 refills | Status: AC | PRN

## 2021-03-13 NOTE — ED Triage Notes (Signed)
Pt c/o neck pain since yesterday, some lower back pain for several days that has been improving. Pt denies any known injury, thinks she may have slept wrong. Pt does have decreased ROM to the neck. Pt denies f/n/v/d, cough, congestion or other symptoms.

## 2021-03-13 NOTE — Discharge Instructions (Signed)
Rest.  Heat.  Medication as directed.  Take care  Dr. Lacinda Axon

## 2021-03-13 NOTE — ED Provider Notes (Signed)
[facility]    CSN: [id number] Arrival date & time: 03/13/21  0904      History   Chief Complaint Chief Complaint  Patient presents with   Torticollis   Back Pain    LB    HPI  75 year old female presents with neck pain and back pain.  Patient reports that she has had low back pain for the past week.  Developed neck pain yesterday.  Denies any fall, trauma, injury.  Decreased range of motion of both the back of the neck.  No respiratory symptoms.  No fever.  Pain reported as 9/10 in severity.  No relieving factors.  No radicular symptoms.  No other complaints.  Past Medical History:  Diagnosis Date   Arthritis    Cancer ([place])    skin ca   COPD (chronic obstructive pulmonary disease) ([place])    Diabetes mellitus without complication ([place])    Edema    GERD (gastroesophageal reflux disease)    Hypertension    Palpitations    Stroke [facility]) 2004   tia    There are no problems to display for this patient.   Past Surgical History:  Procedure Laterality Date   ABDOMINAL HYSTERECTOMY     CATARACT EXTRACTION W/PHACO Left 05/30/2015   Procedure: CATARACT EXTRACTION PHACO AND INTRAOCULAR LENS PLACEMENT (IOC);  Surgeon: Lyla Glassing, MD;  Location: [facility] ORS;  Service: Ophthalmology;  Laterality: Left;  [place]    1:46.3 AP      11.5 CDE    12.15 casette lot [id number]   CORONARY ARTERY BYPASS GRAFT  2014   JOINT REPLACEMENT  [id number]   bil tkr   OPEN REDUCTION INTERNAL FIXATION (ORIF) DISTAL RADIAL FRACTURE Right 08/06/2016   Procedure: OPEN REDUCTION INTERNAL FIXATION (ORIF) DISTAL RADIAL FRACTURE;  Surgeon: Corky Mull, MD;  Location: [facility] ORS;  Service: Orthopedics;  Laterality: Right;    OB History   No obstetric history on file.      Home Medications    Prior to Admission medications   Medication Sig Start Date End Date Taking? Authorizing Provider  acetaminophen (TYLENOL) 650 MG CR tablet Take 650 mg by mouth every 8 (eight) hours as needed for pain.    Yes [provider]  albuterol (PROVENTIL HFA;VENTOLIN HFA) 108 (90 BASE) MCG/ACT inhaler Inhale 1-2 puffs into the lungs every 6 (six) hours as needed for wheezing or shortness of breath.    Yes [provider]  aspirin EC 81 MG tablet Take 81 mg by mouth daily.   Yes [provider]  furosemide (LASIX) 20 MG tablet Take 20 mg by mouth daily.   Yes [provider]  lisinopril (ZESTRIL) 20 MG tablet Take 20 mg by mouth daily. 02/04/21  Yes [provider]  metFORMIN (GLUCOPHAGE) 500 MG tablet Take 500 mg by mouth 2 (two) times daily.    Yes [provider]  mupirocin cream (BACTROBAN) 2 % AAA BID until healed. 04/30/19  Yes Karen Kitchens, NP  oxybutynin (DITROPAN) 5 MG tablet Take 5 mg by mouth daily. 01/20/21  Yes [provider]  rosuvastatin (CRESTOR) 40 MG tablet Take 40 mg by mouth daily.   Yes [provider]  sertraline (ZOLOFT) 25 MG tablet Take 25 mg by mouth every morning. 01/20/21  Yes [provider]  traMADol (ULTRAM) 50 MG tablet Take 1 tablet (50 mg total) by mouth every 12 (twelve) hours as needed for moderate pain or severe pain. 03/13/21  Yes Coral Spikes,  DO  amLODipine (NORVASC) 5 MG tablet Take 5 mg by mouth daily. 06/02/16 04/30/19  [provider]    Family History Family History  Problem Relation Age of Onset   Breast cancer Neg Hx     Social History Social History   Tobacco Use   Smoking status: Every Day    Packs/day: 0.50    Types: Cigarettes   Smokeless tobacco: Never  Vaping Use   Vaping Use: Never used  Substance Use Topics   Alcohol use: No    Alcohol/week: 0.0 standard drinks   Drug use: No     Allergies   Patient has no known allergies.   Review of Systems Review of Systems Per HPI  Physical Exam Triage Vital Signs ED Triage Vitals  Enc Vitals Group     BP 03/13/21 1033 (!) 155/58     Pulse Rate 03/13/21 1033 (!) 59     Resp 03/13/21 1033 18     Temp  03/13/21 1033 98.6 F (37 C)     Temp Source 03/13/21 1033 Oral     SpO2 03/13/21 1033 100 %     Weight 03/13/21 1031 146 lb (66.2 kg)     Height 03/13/21 1031 '4\' 11"'$  (1.499 m)     Head Circumference --      Peak Flow --      Pain Score 03/13/21 1030 9     Pain Loc --      Pain Edu? --      Excl. in [place]? --    Updated Vital Signs BP (!) 155/58 (BP Location: Right Arm)   Pulse (!) 59   Temp 98.6 F (37 C) (Oral)   Resp 18   Ht '4\' 11"'$  (1.499 m)   Wt 66.2 kg   SpO2 100%   BMI 29.49 kg/m   Visual Acuity Right Eye Distance:   Left Eye Distance:   Bilateral Distance:    Right Eye Near:   Left Eye Near:    Bilateral Near:     Physical Exam Constitutional:      General: She is not in acute distress.    Appearance: Normal appearance. She is not ill-appearing.  HENT:     Head: Normocephalic and atraumatic.  Eyes:     General:        Right eye: No discharge.        Left eye: No discharge.     Conjunctiva/sclera: Conjunctivae normal.  Neck:     Comments: Decreased range of motion particularly in left rotation. Cardiovascular:     Rate and Rhythm: Normal rate and regular rhythm.  Pulmonary:     Effort: Pulmonary effort is normal.     Breath sounds: Normal breath sounds.  Musculoskeletal:     Comments: Low back with tenderness to palpation on the right side.  Neurological:     Mental Status: She is alert.  Psychiatric:        Mood and Affect: Mood normal.        Behavior: Behavior normal.     UC Treatments / Results  Labs (all labs ordered are listed, but only abnormal results are displayed) Labs Reviewed - No data to display  EKG   Radiology No results found.  Procedures Procedures (including critical care time)  Medications Ordered in UC Medications - No data to display  Initial Impression / Assessment and Plan / UC Course  I have reviewed the triage vital signs and the nursing notes.  Pertinent  labs & imaging results that were available during my  care of the patient were reviewed by me and considered in my medical decision making (see chart for details).    75 year old female presents with neck pain and back pain.  No indication for imaging.  No red flags on exam or history.  Tramadol as needed.  Final Clinical Impressions(s) / UC Diagnoses   Final diagnoses:  Acute right-sided low back pain without sciatica  Acute neck pain     Discharge Instructions      Rest.  Heat.  Medication as directed.  Take care  Dr. Lacinda Axon    ED Prescriptions     Medication Sig Dispense Auth. Provider   traMADol (ULTRAM) 50 MG tablet Take 1 tablet (50 mg total) by mouth every 12 (twelve) hours as needed for moderate pain or severe pain. 10 tablet Thersa Salt G, DO      I have reviewed the PDMP during this encounter.   Coral Spikes, [place] 03/13/21 1413

## 2021-11-25 DIAGNOSIS — F419 Anxiety disorder, unspecified: Secondary | ICD-10-CM | POA: Insufficient documentation

## 2022-04-15 ENCOUNTER — Other Ambulatory Visit: Payer: Self-pay

## 2022-04-15 ENCOUNTER — Emergency Department: Payer: Medicare HMO

## 2022-04-15 ENCOUNTER — Emergency Department
Admission: EM | Admit: 2022-04-15 | Discharge: 2022-04-15 | Disposition: A | Discharge status: Home / Self Care | Payer: Medicare HMO | Attending: Emergency Medicine | Admitting: Emergency Medicine

## 2022-04-15 DIAGNOSIS — R410 Disorientation, unspecified: Secondary | ICD-10-CM | POA: Insufficient documentation

## 2022-04-15 DIAGNOSIS — F0390 Unspecified dementia without behavioral disturbance: Secondary | ICD-10-CM | POA: Diagnosis not present

## 2022-04-15 DIAGNOSIS — Z950 Presence of cardiac pacemaker: Secondary | ICD-10-CM | POA: Insufficient documentation

## 2022-04-15 DIAGNOSIS — R079 Chest pain, unspecified: Secondary | ICD-10-CM | POA: Insufficient documentation

## 2022-04-15 DIAGNOSIS — R002 Palpitations: Secondary | ICD-10-CM | POA: Diagnosis not present

## 2022-04-15 DIAGNOSIS — Z5321 Procedure and treatment not carried out due to patient leaving prior to being seen by health care provider: Secondary | ICD-10-CM | POA: Insufficient documentation

## 2022-04-15 LAB — COMPREHENSIVE METABOLIC PANEL
ALT: 15 U/L (ref 0–44)
AST: 24 U/L (ref 15–41)
Albumin: 4.3 g/dL (ref 3.5–5.0)
Alkaline Phosphatase: 75 U/L (ref 38–126)
Anion gap: 7 (ref 5–15)
BUN: 28 mg/dL — ABNORMAL HIGH (ref 8–23)
CO2: 24 mmol/L (ref 22–32)
Calcium: 9.4 mg/dL (ref 8.9–10.3)
Chloride: 106 mmol/L (ref 98–111)
Creatinine, Ser: 1.59 mg/dL — ABNORMAL HIGH (ref 0.44–1.00)
GFR, Estimated: 33 mL/min — ABNORMAL LOW (ref >60)
Glucose, Bld: 87 mg/dL (ref 70–99)
Potassium: 5.2 mmol/L — ABNORMAL HIGH (ref 3.5–5.1)
Sodium: 137 mmol/L (ref 135–145)
Total Bilirubin: 0.7 mg/dL (ref 0.3–1.2)
Total Protein: 7.7 g/dL (ref 6.5–8.1)

## 2022-04-15 LAB — CBC WITH DIFFERENTIAL/PLATELET
Abs Immature Granulocytes: 0.02 10*3/uL (ref 0.00–0.07)
Basophils Absolute: 0 10*3/uL (ref 0.0–0.1)
Basophils Relative: 1 %
Eosinophils Absolute: 0.2 10*3/uL (ref 0.0–0.5)
Eosinophils Relative: 2 %
HCT: 37.9 % (ref 36.0–46.0)
Hemoglobin: 11.7 g/dL — ABNORMAL LOW (ref 12.0–15.0)
Immature Granulocytes: 0 %
Lymphocytes Relative: 30 %
Lymphs Abs: 2 10*3/uL (ref 0.7–4.0)
MCH: 30.3 pg (ref 26.0–34.0)
MCHC: 30.9 g/dL (ref 30.0–36.0)
MCV: 98.2 fL (ref 80.0–100.0)
Monocytes Absolute: 0.3 10*3/uL (ref 0.1–1.0)
Monocytes Relative: 5 %
Neutro Abs: 4.2 10*3/uL (ref 1.7–7.7)
Neutrophils Relative %: 62 %
Platelets: 212 10*3/uL (ref 150–400)
RBC: 3.86 MIL/uL — ABNORMAL LOW (ref 3.87–5.11)
RDW: 13 % (ref 11.5–15.5)
WBC: 6.7 10*3/uL (ref 4.0–10.5)
nRBC: 0 % (ref 0.0–0.2)

## 2022-04-15 LAB — TROPONIN I (HIGH SENSITIVITY)
Troponin I (High Sensitivity): 14 ng/L (ref <18)
Troponin I (High Sensitivity): 12 ng/L (ref <18)

## 2022-04-15 LAB — CBG MONITORING, ED: Glucose-Capillary: 85 mg/dL (ref 70–99)

## 2022-04-15 NOTE — ED Triage Notes (Addendum)
Pt BIB EMS for c/o CP and palpations that started earlier. Pt has dementia and now doesn't have any complaints. Pt is anxious in triage and seems more confused that normal per family. Pt does have a pacemaker.

## 2022-04-15 NOTE — ED Provider Triage Note (Signed)
Emergency Medicine Provider Triage Evaluation Note  Jody Stewart , a 76 y.o. female  was evaluated in triage.  Pt complains of chest pain, shortness of breath, more confusion.  Son states she lives alone..  Review of Systems  Positive: Altered mental status, chest pain Negative:   Physical Exam  BP (!) 146/75   Pulse 84   Temp 97.9 F (36.6 C)   Resp 20   Wt 65.8 kg   SpO2 99%   BMI 29.29 kg/m  Gen:   Awake, no distress   Resp:  Normal effort  MSK:   Moves extremities without difficulty  Other:    Medical Decision Making  Medically screening exam initiated at 3:59 PM.  Appropriate orders placed.  Jody Stewart was informed that the remainder of the evaluation will be completed by another provider, this initial triage assessment does not replace that evaluation, and the importance of remaining in the ED until their evaluation is complete.     Versie Starks, PA-C 04/15/22 1600

## 2022-09-24 DIAGNOSIS — I482 Chronic atrial fibrillation, unspecified: Secondary | ICD-10-CM | POA: Diagnosis present

## 2022-12-22 ENCOUNTER — Ambulatory Visit
Admission: EM | Admit: 2022-12-22 | Discharge: 2022-12-22 | Disposition: A | Discharge status: Home / Self Care | Payer: Medicare HMO | Attending: Emergency Medicine | Admitting: Emergency Medicine

## 2022-12-22 DIAGNOSIS — B3731 Acute candidiasis of vulva and vagina: Secondary | ICD-10-CM | POA: Insufficient documentation

## 2022-12-22 LAB — WET PREP, GENITAL
Clue Cells Wet Prep HPF POC: NONE SEEN
Sperm: NONE SEEN
Trich, Wet Prep: NONE SEEN
WBC, Wet Prep HPF POC: <10 (ref <10)

## 2022-12-22 LAB — URINALYSIS, W/ REFLEX TO CULTURE (INFECTION SUSPECTED)
Bacteria, UA: NONE SEEN
Bilirubin Urine: NEGATIVE
Glucose, UA: NEGATIVE mg/dL
Hgb urine dipstick: NEGATIVE
Ketones, ur: NEGATIVE mg/dL
Leukocytes,Ua: NEGATIVE
Nitrite: NEGATIVE
Protein, ur: NEGATIVE mg/dL
Specific Gravity, Urine: 1.015 (ref 1.005–1.030)
pH: 7 (ref 5.0–8.0)

## 2022-12-22 MED ORDER — FLUCONAZOLE 150 MG PO TABS: 150.0000 mg | ORAL_TABLET | ORAL | 0 refills | Status: AC

## 2022-12-22 NOTE — Discharge Instructions (Addendum)
Your test today did reveal that you have a vaginal yeast infection.  This is most likely what is causing your burning.  Take Diflucan 150 mg, 1 tablet now and repeat every 3 days for total of 3 doses.  If your symptoms do not improve, or new symptoms develop, either return for reevaluation or see your primary care provider.

## 2022-12-22 NOTE — ED Triage Notes (Signed)
Pt c/o urinary freq,burning & pain x1 day. Denies any hematuria.  

## 2022-12-22 NOTE — ED Provider Notes (Signed)
[facility]    CSN: [id number] Arrival date & time: 12/22/22  0909      History   Chief Complaint Chief Complaint  Patient presents with   Dysuria    HPI Jody Stewart is a 77 y.o. female.   HPI  77 year old female with a past medical history significant for COPD, hypertension, diabetes, GERD, palpitations, CVA, and dementia presents for evaluation of urinary symptoms which began last night.  The patient is here with her son who reports that his mom's been complaining of burning in her perineal area when she goes to the bathroom.  Possible urgency and frequency.  Unknown about nocturia as patient wears a pad at night.  She has not had fever, nausea or vomiting, and noticing blood in the urine.  Past Medical History:  Diagnosis Date   Arthritis    Cancer ([facility])    skin ca   COPD (chronic obstructive pulmonary disease) ([facility])    Diabetes mellitus without complication ([facility])    Edema    GERD (gastroesophageal reflux disease)    Hypertension    Palpitations    Stroke [facility]) 2004   tia    There are no problems to display for this patient.   Past Surgical History:  Procedure Laterality Date   ABDOMINAL HYSTERECTOMY     CATARACT EXTRACTION W/PHACO Left 05/30/2015   Procedure: CATARACT EXTRACTION PHACO AND INTRAOCULAR LENS PLACEMENT (IOC);  Surgeon: Lia Hopping, MD;  Location: [facility] ORS;  Service: Ophthalmology;  Laterality: Left;  [place]    1:46.3 AP      11.5 CDE    12.15 casette lot #[id number]   CORONARY ARTERY BYPASS GRAFT  2014   JOINT REPLACEMENT  [id number]   bil tkr   OPEN REDUCTION INTERNAL FIXATION (ORIF) DISTAL RADIAL FRACTURE Right 08/06/2016   Procedure: OPEN REDUCTION INTERNAL FIXATION (ORIF) DISTAL RADIAL FRACTURE;  Surgeon: Christena Flake, MD;  Location: [facility] ORS;  Service: Orthopedics;  Laterality: Right;    OB History   No obstetric history on file.      Home Medications    Prior to Admission medications   Medication Sig Start Date End Date  Taking? Authorizing Provider  acetaminophen (TYLENOL) 650 MG CR tablet Take 650 mg by mouth every 8 (eight) hours as needed for pain.   Yes [provider]  albuterol (PROVENTIL HFA;VENTOLIN HFA) 108 (90 BASE) MCG/ACT inhaler Inhale 1-2 puffs into the lungs every 6 (six) hours as needed for wheezing or shortness of breath.    Yes [provider]  amLODipine (NORVASC) 5 MG tablet Take 5 mg by mouth daily. 06/02/16 12/22/22 Yes [provider]  aspirin EC 81 MG tablet Take 81 mg by mouth daily.   Yes [provider]  donepezil (ARICEPT) 5 MG tablet Take by mouth. 11/10/21  Yes [provider]  ELIQUIS 5 MG TABS tablet Take 5 mg by mouth 2 (two) times daily.   Yes [provider]  fluconazole (DIFLUCAN) 150 MG tablet Take 1 tablet (150 mg total) by mouth every 3 (three) days for 3 doses. 12/22/22 12/29/22 Yes Becky Augusta, NP  furosemide (LASIX) 20 MG tablet Take 20 mg by mouth daily.   Yes [provider]  lisinopril (ZESTRIL) 20 MG tablet Take 20 mg by mouth daily. 02/04/21  Yes [provider]  metFORMIN (GLUCOPHAGE) 500 MG tablet Take 500 mg by mouth 2 (two) times daily.    Yes [provider]  metoprolol succinate (TOPROL-XL) 25 MG 24  hr tablet Take 1 tablet by mouth daily. 10/01/22 11/30/23 Yes [provider]  mupirocin cream (BACTROBAN) 2 % AAA BID until healed. 04/30/19  Yes Verlee Monte, NP  oxybutynin (DITROPAN) 5 MG tablet Take 5 mg by mouth daily. 01/20/21  Yes [provider]  pantoprazole (PROTONIX) 40 MG tablet Take 40 mg by mouth 2 (two) times daily.   Yes [provider]  rosuvastatin (CRESTOR) 40 MG tablet Take 40 mg by mouth daily.   Yes [provider]  sertraline (ZOLOFT) 25 MG tablet Take 25 mg by mouth every morning. 01/20/21  Yes [provider]  traMADol (ULTRAM) 50 MG tablet Take 1 tablet (50 mg total) by mouth every 12 (twelve) hours as needed for moderate pain or  severe pain. 03/13/21  Yes Tommie Sams, DO    Family History Family History  Problem Relation Age of Onset   Breast cancer Neg Hx     Social History Social History   Tobacco Use   Smoking status: Every Day    Packs/day: .5    Types: Cigarettes   Smokeless tobacco: Never  Vaping Use   Vaping Use: Never used  Substance Use Topics   Alcohol use: No    Alcohol/week: 0.0 standard drinks of alcohol   Drug use: No     Allergies   Patient has no known allergies.   Review of Systems Review of Systems  Constitutional:  Negative for fever.  Gastrointestinal:  Negative for nausea and vomiting.  Genitourinary:  Positive for dysuria, frequency and urgency. Negative for hematuria.     Physical Exam Triage Vital Signs ED Triage Vitals  Enc Vitals Group     BP      Pulse      Resp      Temp      Temp src      SpO2      Weight      Height      Head Circumference      Peak Flow      Pain Score      Pain Loc      Pain Edu?      Excl. in [place]?    No data found.  Updated Vital Signs BP (!) 123/58 (BP Location: Left Arm)   Pulse 86   Temp 98.3 F (36.8 C) (Oral)   Resp 16   Ht 4\' 11"  (1.499 m)   Wt 150 lb (68 kg)   SpO2 95%   BMI 30.30 kg/m   Visual Acuity Right Eye Distance:   Left Eye Distance:   Bilateral Distance:    Right Eye Near:   Left Eye Near:    Bilateral Near:     Physical Exam Vitals and nursing note reviewed.  Constitutional:      Appearance: Normal appearance. She is not ill-appearing.  HENT:     Head: Normocephalic and atraumatic.  Cardiovascular:     Rate and Rhythm: Normal rate and regular rhythm.     Pulses: Normal pulses.     Heart sounds: Normal heart sounds. No murmur heard.    No friction rub. No gallop.  Pulmonary:     Effort: Pulmonary effort is normal.     Breath sounds: Normal breath sounds. No wheezing, rhonchi or rales.  Abdominal:     Tenderness: There is no right CVA tenderness or left CVA tenderness.  Skin:     General: Skin is warm and dry.  Capillary Refill: Capillary refill takes less than 2 seconds.  Neurological:     Mental Status: She is alert.      UC Treatments / Results  Labs (all labs ordered are listed, but only abnormal results are displayed) Labs Reviewed  WET PREP, GENITAL  URINALYSIS, W/ REFLEX TO CULTURE (INFECTION SUSPECTED)    EKG   Radiology No results found.  Procedures Procedures (including critical care time)  Medications Ordered in UC Medications - No data to display  Initial Impression / Assessment and Plan / UC Course  I have reviewed the triage vital signs and the nursing notes.  Pertinent labs & imaging results that were available during my care of the patient were reviewed by me and considered in my medical decision making (see chart for details).   Patient is a nontoxic-appearing 77 year old female with a history of dementia presenting for evaluation of burning with urination and urinary urgency and frequency that started last night.  No fever, nausea, or vomiting.  No hematuria.  No CVA tenderness on exam.  Patient was unable to provide a urine specimen at triage and was provided water.  I will have staff reinquire in 15 minutes to see if she can provide a urine specimen.  Labs not crossing over to epic.  I did speak to the lab and visually verified the urinalysis results which were negative for leukocyte esterase, nitrates, or protein.  Also no hemoglobin.  Reflex microscopy did show some squamous epithelials but no white cells, red cells, or bacteria.  I will order a vaginal wet prep.  With Tiara the RN, Annice Pih the CMA, and Lars Mage the CMA we assisted patient into the stretcher in room 9 for a perineal examination.  Patient does have erythema to her vulva as well as her vaginal introitus with some white discharge.  I collected a wet prep swab and will send it for analysis.  The patient's son is also sitting the patient not complaining of both of her  ears bothering her.  Otoscopic exam reveals mild cerumen in both external auditory canals but no erythema to the tympanic membranes or erythema to either ear canal.  Wet prep does show the presence of yeast but is negative for clue cells or trichomoniasis.  I will discharge patient home with a diagnosis of vaginal yeast infection and treated with Diflucan 150 mg tablets, once now and repeat every 3 days for 3 doses.   Final Clinical Impressions(s) / UC Diagnoses   Final diagnoses:  Vaginal yeast infection     Discharge Instructions      Your test today did reveal that you have a vaginal yeast infection.  This is most likely what is causing your burning.  Take Diflucan 150 mg, 1 tablet now and repeat every 3 days for total of 3 doses.  If your symptoms do not improve, or new symptoms develop, either return for reevaluation or see your primary care provider.     ED Prescriptions     Medication Sig Dispense Auth. Provider   fluconazole (DIFLUCAN) 150 MG tablet Take 1 tablet (150 mg total) by mouth every 3 (three) days for 3 doses. 3 tablet Becky Augusta, NP      PDMP not reviewed this encounter.   Becky Augusta, NP 12/22/22 1242

## 2023-01-05 ENCOUNTER — Emergency Department: Payer: Medicare HMO

## 2023-01-05 ENCOUNTER — Ambulatory Visit: Admission: EM | Admit: 2023-01-05 | Discharge: 2023-01-05 | Payer: Medicare HMO

## 2023-01-05 ENCOUNTER — Inpatient Hospital Stay
Admission: EM | Admit: 2023-01-05 | Discharge: 2023-01-08 | DRG: 948 | Disposition: A | Discharge status: Home / Self Care | Payer: Medicare HMO | Attending: Internal Medicine | Admitting: Internal Medicine

## 2023-01-05 ENCOUNTER — Encounter: Payer: Self-pay | Admitting: Emergency Medicine

## 2023-01-05 DIAGNOSIS — K219 Gastro-esophageal reflux disease without esophagitis: Secondary | ICD-10-CM | POA: Diagnosis present

## 2023-01-05 DIAGNOSIS — J449 Chronic obstructive pulmonary disease, unspecified: Secondary | ICD-10-CM | POA: Diagnosis present

## 2023-01-05 DIAGNOSIS — E860 Dehydration: Secondary | ICD-10-CM

## 2023-01-05 DIAGNOSIS — R400 Somnolence: Secondary | ICD-10-CM | POA: Diagnosis present

## 2023-01-05 DIAGNOSIS — M1990 Unspecified osteoarthritis, unspecified site: Secondary | ICD-10-CM | POA: Diagnosis present

## 2023-01-05 DIAGNOSIS — R112 Nausea with vomiting, unspecified: Secondary | ICD-10-CM

## 2023-01-05 DIAGNOSIS — R1030 Lower abdominal pain, unspecified: Secondary | ICD-10-CM | POA: Diagnosis not present

## 2023-01-05 DIAGNOSIS — Z950 Presence of cardiac pacemaker: Secondary | ICD-10-CM

## 2023-01-05 DIAGNOSIS — Z7901 Long term (current) use of anticoagulants: Secondary | ICD-10-CM

## 2023-01-05 DIAGNOSIS — I4821 Permanent atrial fibrillation: Secondary | ICD-10-CM | POA: Diagnosis present

## 2023-01-05 DIAGNOSIS — W19XXXA Unspecified fall, initial encounter: Secondary | ICD-10-CM | POA: Diagnosis not present

## 2023-01-05 DIAGNOSIS — I429 Cardiomyopathy, unspecified: Secondary | ICD-10-CM | POA: Diagnosis present

## 2023-01-05 DIAGNOSIS — I4891 Unspecified atrial fibrillation: Secondary | ICD-10-CM | POA: Diagnosis not present

## 2023-01-05 DIAGNOSIS — R54 Age-related physical debility: Secondary | ICD-10-CM

## 2023-01-05 DIAGNOSIS — Z951 Presence of aortocoronary bypass graft: Secondary | ICD-10-CM

## 2023-01-05 DIAGNOSIS — I495 Sick sinus syndrome: Secondary | ICD-10-CM | POA: Diagnosis present

## 2023-01-05 DIAGNOSIS — W1830XA Fall on same level, unspecified, initial encounter: Secondary | ICD-10-CM | POA: Diagnosis present

## 2023-01-05 DIAGNOSIS — R630 Anorexia: Secondary | ICD-10-CM | POA: Diagnosis present

## 2023-01-05 DIAGNOSIS — K269 Duodenal ulcer, unspecified as acute or chronic, without hemorrhage or perforation: Secondary | ICD-10-CM | POA: Diagnosis present

## 2023-01-05 DIAGNOSIS — R110 Nausea: Secondary | ICD-10-CM | POA: Diagnosis present

## 2023-01-05 DIAGNOSIS — I2510 Atherosclerotic heart disease of native coronary artery without angina pectoris: Secondary | ICD-10-CM | POA: Diagnosis present

## 2023-01-05 DIAGNOSIS — Z7984 Long term (current) use of oral hypoglycemic drugs: Secondary | ICD-10-CM

## 2023-01-05 DIAGNOSIS — Z8711 Personal history of peptic ulcer disease: Secondary | ICD-10-CM

## 2023-01-05 DIAGNOSIS — K259 Gastric ulcer, unspecified as acute or chronic, without hemorrhage or perforation: Secondary | ICD-10-CM | POA: Diagnosis present

## 2023-01-05 DIAGNOSIS — Z8673 Personal history of transient ischemic attack (TIA), and cerebral infarction without residual deficits: Secondary | ICD-10-CM

## 2023-01-05 DIAGNOSIS — I10 Essential (primary) hypertension: Secondary | ICD-10-CM | POA: Insufficient documentation

## 2023-01-05 DIAGNOSIS — I130 Hypertensive heart and chronic kidney disease with heart failure and stage 1 through stage 4 chronic kidney disease, or unspecified chronic kidney disease: Secondary | ICD-10-CM | POA: Diagnosis present

## 2023-01-05 DIAGNOSIS — Z7982 Long term (current) use of aspirin: Secondary | ICD-10-CM

## 2023-01-05 DIAGNOSIS — I959 Hypotension, unspecified: Secondary | ICD-10-CM | POA: Diagnosis present

## 2023-01-05 DIAGNOSIS — I5022 Chronic systolic (congestive) heart failure: Secondary | ICD-10-CM | POA: Diagnosis present

## 2023-01-05 DIAGNOSIS — N189 Chronic kidney disease, unspecified: Secondary | ICD-10-CM | POA: Diagnosis present

## 2023-01-05 DIAGNOSIS — R000 Tachycardia, unspecified: Secondary | ICD-10-CM | POA: Diagnosis present

## 2023-01-05 DIAGNOSIS — F0390 Unspecified dementia without behavioral disturbance: Secondary | ICD-10-CM | POA: Diagnosis present

## 2023-01-05 DIAGNOSIS — R531 Weakness: Secondary | ICD-10-CM | POA: Diagnosis not present

## 2023-01-05 DIAGNOSIS — I2489 Other forms of acute ischemic heart disease: Secondary | ICD-10-CM | POA: Diagnosis present

## 2023-01-05 DIAGNOSIS — R7989 Other specified abnormal findings of blood chemistry: Secondary | ICD-10-CM

## 2023-01-05 DIAGNOSIS — E119 Type 2 diabetes mellitus without complications: Secondary | ICD-10-CM

## 2023-01-05 DIAGNOSIS — M7989 Other specified soft tissue disorders: Secondary | ICD-10-CM | POA: Diagnosis present

## 2023-01-05 DIAGNOSIS — F17210 Nicotine dependence, cigarettes, uncomplicated: Secondary | ICD-10-CM | POA: Diagnosis present

## 2023-01-05 DIAGNOSIS — R509 Fever, unspecified: Secondary | ICD-10-CM | POA: Diagnosis present

## 2023-01-05 DIAGNOSIS — R6510 Systemic inflammatory response syndrome (SIRS) of non-infectious origin without acute organ dysfunction: Secondary | ICD-10-CM | POA: Diagnosis present

## 2023-01-05 DIAGNOSIS — Z96653 Presence of artificial knee joint, bilateral: Secondary | ICD-10-CM | POA: Diagnosis present

## 2023-01-05 DIAGNOSIS — Z79899 Other long term (current) drug therapy: Secondary | ICD-10-CM

## 2023-01-05 DIAGNOSIS — I482 Chronic atrial fibrillation, unspecified: Secondary | ICD-10-CM | POA: Diagnosis present

## 2023-01-05 DIAGNOSIS — R519 Headache, unspecified: Secondary | ICD-10-CM | POA: Diagnosis not present

## 2023-01-05 DIAGNOSIS — E1122 Type 2 diabetes mellitus with diabetic chronic kidney disease: Secondary | ICD-10-CM | POA: Diagnosis present

## 2023-01-05 DIAGNOSIS — Z1152 Encounter for screening for COVID-19: Secondary | ICD-10-CM

## 2023-01-05 DIAGNOSIS — I714 Abdominal aortic aneurysm, without rupture, unspecified: Secondary | ICD-10-CM | POA: Diagnosis present

## 2023-01-05 LAB — BASIC METABOLIC PANEL
Anion gap: 11 (ref 5–15)
BUN: 23 mg/dL (ref 8–23)
CO2: 20 mmol/L — ABNORMAL LOW (ref 22–32)
Calcium: 9 mg/dL (ref 8.9–10.3)
Chloride: 105 mmol/L (ref 98–111)
Creatinine, Ser: 1.65 mg/dL — ABNORMAL HIGH (ref 0.44–1.00)
GFR, Estimated: 32 mL/min — ABNORMAL LOW (ref >60)
Glucose, Bld: 132 mg/dL — ABNORMAL HIGH (ref 70–99)
Potassium: 3.7 mmol/L (ref 3.5–5.1)
Sodium: 136 mmol/L (ref 135–145)

## 2023-01-05 LAB — TROPONIN I (HIGH SENSITIVITY): Troponin I (High Sensitivity): 140 ng/L (ref <18)

## 2023-01-05 LAB — URINALYSIS, ROUTINE W REFLEX MICROSCOPIC
Bilirubin Urine: NEGATIVE
Glucose, UA: NEGATIVE mg/dL
Hgb urine dipstick: NEGATIVE
Ketones, ur: NEGATIVE mg/dL
Leukocytes,Ua: NEGATIVE
Nitrite: NEGATIVE
Protein, ur: 100 mg/dL — AB
Specific Gravity, Urine: 1.02 (ref 1.005–1.030)
pH: 6 (ref 5.0–8.0)

## 2023-01-05 LAB — CBC
HCT: 33.9 % — ABNORMAL LOW (ref 36.0–46.0)
Hemoglobin: 10.5 g/dL — ABNORMAL LOW (ref 12.0–15.0)
MCH: 27.3 pg (ref 26.0–34.0)
MCHC: 31 g/dL (ref 30.0–36.0)
MCV: 88.1 fL (ref 80.0–100.0)
Platelets: 210 10*3/uL (ref 150–400)
RBC: 3.85 MIL/uL — ABNORMAL LOW (ref 3.87–5.11)
RDW: 14.5 % (ref 11.5–15.5)
WBC: 7.4 10*3/uL (ref 4.0–10.5)
nRBC: 0 % (ref 0.0–0.2)

## 2023-01-05 LAB — HEPATIC FUNCTION PANEL
ALT: 13 U/L (ref 0–44)
AST: 23 U/L (ref 15–41)
Albumin: 4 g/dL (ref 3.5–5.0)
Alkaline Phosphatase: 79 U/L (ref 38–126)
Bilirubin, Direct: 0.2 mg/dL (ref 0.0–0.2)
Indirect Bilirubin: 0.7 mg/dL (ref 0.3–0.9)
Total Bilirubin: 0.9 mg/dL (ref 0.3–1.2)
Total Protein: 7.6 g/dL (ref 6.5–8.1)

## 2023-01-05 MED ORDER — METRONIDAZOLE 500 MG/100ML IV SOLN
500.0000 mg | Freq: Once | INTRAVENOUS | Status: AC
  Administered 2023-01-05: 500 mg via INTRAVENOUS
  Filled 2023-01-05: qty 100

## 2023-01-05 MED ORDER — SODIUM CHLORIDE 0.9 % IV SOLN
2.0000 g | Freq: Once | INTRAVENOUS | Status: AC
  Administered 2023-01-05: 2 g via INTRAVENOUS
  Filled 2023-01-05: qty 12.5

## 2023-01-05 MED ORDER — VANCOMYCIN HCL 1500 MG/300ML IV SOLN
1500.0000 mg | Freq: Once | INTRAVENOUS | Status: AC
  Administered 2023-01-06: 1500 mg via INTRAVENOUS
  Filled 2023-01-05: qty 300

## 2023-01-05 MED ORDER — SODIUM CHLORIDE 0.9 % IV BOLUS
1000.0000 mL | Freq: Once | INTRAVENOUS | Status: AC
  Administered 2023-01-05: 1000 mL via INTRAVENOUS

## 2023-01-05 MED ORDER — DILTIAZEM HCL 25 MG/5ML IV SOLN: 10.0000 mg | Freq: Once | INTRAVENOUS | Status: DC

## 2023-01-05 NOTE — ED Provider Notes (Signed)
   [facility] Provider Note    Event Date/Time   First MD Initiated Contact with Patient 01/05/23 2121     (approximate)   History   Fall   HPI  Jody Stewart is a 77 y.o. female  ***       Physical Exam   Triage Vital Signs: ED Triage Vitals [01/05/23 2005]  Enc Vitals Group     BP (!) 133/96     Pulse Rate (!) 106     Resp 18     Temp 99 F (37.2 C)     Temp Source Oral     SpO2 95 %     Weight      Height      Head Circumference      Peak Flow      Pain Score      Pain Loc      Pain Edu?      Excl. in [place]?     Most recent vital signs: Vitals:   01/05/23 2005  BP: (!) 133/96  Pulse: (!) 106  Resp: 18  Temp: 99 F (37.2 C)  SpO2: 95%     General: Awake, no distress. *** CV:  Good peripheral perfusion. *** Resp:  Normal work of breathing. *** Abd:  No distention. *** Other:  ***   ED Results / Procedures / Treatments   Labs (all labs ordered are listed, but only abnormal results are displayed) Labs Reviewed  CBC - Abnormal; Notable for the following components:      Result Value   RBC 3.85 (*)    Hemoglobin 10.5 (*)    HCT 33.9 (*)    All other components within normal limits  BASIC METABOLIC PANEL - Abnormal; Notable for the following components:   CO2 20 (*)    Glucose, Bld 132 (*)    Creatinine, Ser 1.65 (*)    GFR, Estimated 32 (*)    All other components within normal limits  URINALYSIS, ROUTINE W REFLEX MICROSCOPIC     EKG ***   RADIOLOGY ***   ***I also independently reviewed and agree with radiologist interpretations.   PROCEDURES:  Critical Care performed: {CriticalCareYesNo:19197::"Yes, see critical care procedure note(s)","No"}  ***   MEDICATIONS ORDERED IN ED: Medications - No data to display   IMPRESSION / MDM / ASSESSMENT AND PLAN / ED COURSE  I reviewed the triage vital signs and the nursing notes.                              Differential diagnosis includes, but is not  limited to, ***  Patient's presentation is most consistent with {EM COPA:27473}  {If the patient is on the monitor, remove the brackets and asterisks on the sentence below and remember to document it as a Procedure as well. Otherwise delete the sentence below:1} ***The patient is on the cardiac monitor to evaluate for evidence of arrhythmia and/or significant heart rate changes      FINAL CLINICAL IMPRESSION(S) / ED DIAGNOSES   Final diagnoses:  None     Rx / DC Orders   ED Discharge Orders     None        Note:  This document was prepared using Dragon voice recognition software and may include unintentional dictation errors.

## 2023-01-05 NOTE — ED Notes (Signed)
Pt brought to er by granddaughter. Pt has had a recent fall and has right hip pain and nausea.  Hx dementia.  Pt has been ambulating at home per family.  Pt has hx afib/pacemaker.  Pt is on blood thinners.  Pt alert.

## 2023-01-05 NOTE — ED Provider Notes (Signed)
[facility]    CSN: [id number] Arrival date & time: 01/05/23  1849      History   Chief Complaint Chief Complaint  Patient presents with   Fall    HPI Jody Stewart is a 77 y.o. female.   HPI  77 year old female with a past medical history significant for diabetes, hypertension, stroke, COPD, GERD, dementia, and palpitations presents for evaluation after suffering an unwitnessed fall 2 days ago.  She is with her granddaughter who does not stay with the patient.  The granddaughter reports that she was notified by her uncle 2 days ago that her grandmother had fallen.  Another family member found her on the floor.  It was estimated that she may have been down for up to 20 minutes.  Yesterday she slept most of the day.  She has been taking her medications but she has not been eating.  She has been drinking small amounts of fluids.  Today she started to complain of a headache and low back pain.  Approximately an hour before arrival she developed nausea and dry heaving.  The patient has deep sighing respirations in the exam room.  She is on Eliquis.  Past Medical History:  Diagnosis Date   Arthritis    Cancer (HCC)    skin ca   COPD (chronic obstructive pulmonary disease) (HCC)    Diabetes mellitus without complication (HCC)    Edema    GERD (gastroesophageal reflux disease)    Hypertension    Palpitations    Stroke [facility]) 2004   tia    There are no problems to display for this patient.   Past Surgical History:  Procedure Laterality Date   ABDOMINAL HYSTERECTOMY     CATARACT EXTRACTION W/PHACO Left 05/30/2015   Procedure: CATARACT EXTRACTION PHACO AND INTRAOCULAR LENS PLACEMENT (IOC);  Surgeon: Lia Hopping, MD;  Location: [facility] ORS;  Service: Ophthalmology;  Laterality: Left;  [place]    1:46.3 AP      11.5 CDE    12.15 casette lot #[id number]   CORONARY ARTERY BYPASS GRAFT  2014   JOINT REPLACEMENT  [id number]   bil tkr   OPEN REDUCTION INTERNAL FIXATION (ORIF)  DISTAL RADIAL FRACTURE Right 08/06/2016   Procedure: OPEN REDUCTION INTERNAL FIXATION (ORIF) DISTAL RADIAL FRACTURE;  Surgeon: Christena Flake, MD;  Location: [facility] ORS;  Service: Orthopedics;  Laterality: Right;    OB History   No obstetric history on file.      Home Medications    Prior to Admission medications   Medication Sig Start Date End Date Taking? Authorizing Provider  acetaminophen (TYLENOL) 650 MG CR tablet Take 650 mg by mouth every 8 (eight) hours as needed for pain.    [provider]  albuterol (PROVENTIL HFA;VENTOLIN HFA) 108 (90 BASE) MCG/ACT inhaler Inhale 1-2 puffs into the lungs every 6 (six) hours as needed for wheezing or shortness of breath.     [provider]  amLODipine (NORVASC) 5 MG tablet Take 5 mg by mouth daily. 06/02/16 12/22/22  [provider]  aspirin EC 81 MG tablet Take 81 mg by mouth daily.    [provider]  donepezil (ARICEPT) 5 MG tablet Take by mouth. 11/10/21   [provider]  ELIQUIS 5 MG TABS tablet Take 5 mg by mouth 2 (two) times daily.    [provider]  furosemide (LASIX) 20 MG tablet Take 20 mg by mouth daily.    [provider]  lisinopril (ZESTRIL) 20  MG tablet Take 20 mg by mouth daily. 02/04/21   [provider]  metFORMIN (GLUCOPHAGE) 500 MG tablet Take 500 mg by mouth 2 (two) times daily.     [provider]  metoprolol succinate (TOPROL-XL) 25 MG 24 hr tablet Take 1 tablet by mouth daily. 10/01/22 11/30/23  [provider]  mupirocin cream (BACTROBAN) 2 % AAA BID until healed. 04/30/19   Verlee Monte, NP  oxybutynin (DITROPAN) 5 MG tablet Take 5 mg by mouth daily. 01/20/21   [provider]  pantoprazole (PROTONIX) 40 MG tablet Take 40 mg by mouth 2 (two) times daily.    [provider]  rosuvastatin (CRESTOR) 40 MG tablet Take 40 mg by mouth daily.    [provider]  sertraline (ZOLOFT) 25 MG tablet Take 25 mg by mouth every  morning. 01/20/21   [provider]  traMADol (ULTRAM) 50 MG tablet Take 1 tablet (50 mg total) by mouth every 12 (twelve) hours as needed for moderate pain or severe pain. 03/13/21   Tommie Sams, DO    Family History Family History  Problem Relation Age of Onset   Breast cancer Neg Hx     Social History Social History   Tobacco Use   Smoking status: Former    Packs/day: .5    Types: Cigarettes   Smokeless tobacco: Never  Vaping Use   Vaping Use: Never used  Substance Use Topics   Alcohol use: No    Alcohol/week: 0.0 standard drinks of alcohol   Drug use: No     Allergies   Patient has no known allergies.   Review of Systems Review of Systems  Gastrointestinal:  Positive for nausea and vomiting.  Musculoskeletal:  Positive for back pain.  Neurological:  Positive for headaches.     Physical Exam Triage Vital Signs ED Triage Vitals  Enc Vitals Group     BP      Pulse      Resp      Temp      Temp src      SpO2      Weight      Height      Head Circumference      Peak Flow      Pain Score      Pain Loc      Pain Edu?      Excl. in GC?    No data found.  Updated Vital Signs BP 126/78 (BP Location: Left Arm)   Pulse 92   Temp 98.4 F (36.9 C)   Resp 16   SpO2 100%   Visual Acuity Right Eye Distance:   Left Eye Distance:   Bilateral Distance:    Right Eye Near:   Left Eye Near:    Bilateral Near:     Physical Exam Vitals and nursing note reviewed.  Constitutional:      Appearance: She is not ill-appearing.  HENT:     Head: Normocephalic and atraumatic.     Mouth/Throat:     Mouth: Mucous membranes are dry.     Pharynx: Oropharynx is clear.  Eyes:     General: No scleral icterus.    Extraocular Movements: Extraocular movements intact.     Pupils: Pupils are equal, round, and reactive to light.  Cardiovascular:     Rate and Rhythm: Normal rate and regular rhythm.     Pulses: Normal pulses.     Heart sounds: Normal heart  sounds. No  murmur heard.    No friction rub. No gallop.  Pulmonary:     Effort: Pulmonary effort is normal.     Breath sounds: Normal breath sounds. No wheezing, rhonchi or rales.  Skin:    General: Skin is warm and dry.     Capillary Refill: Capillary refill takes less than 2 seconds.  Neurological:     General: No focal deficit present.     Mental Status: She is alert.      UC Treatments / Results  Labs (all labs ordered are listed, but only abnormal results are displayed) Labs Reviewed - No data to display  EKG   Radiology No results found.  Procedures Procedures (including critical care time)  Medications Ordered in UC Medications - No data to display  Initial Impression / Assessment and Plan / UC Course  I have reviewed the triage vital signs and the nursing notes.  Pertinent labs & imaging results that were available during my care of the patient were reviewed by me and considered in my medical decision making (see chart for details).  Patient is a pleasant 77 year old female with a history of dementia here for evaluation of low back pain, headache, nausea, and dry heaving as outlined in HPI above.  In the exam room her pupils are equal round reactive and her EOM is intact.  Her oral mucous membranes are dry and there are fissures in her tongue.  She also has dry lips.  She has deep sighing respirations but there is no acetone present on the patient's breath.  Lung sounds are clear to auscultation all fields.  Of concern is the fact that patient had an unwitnessed fall 2 days ago with an undetermined downtime.  She has been continue to take her medications but has not been eating and drinking little fluid.  She is also taking Eliquis.  Given the unwitnessed fall coupled with nausea and vomiting and headache and concern for acute intracranial process.  The deep sighing respirations are also concerning for dehydration and possible developing metabolic acidosis.  I feel the  patient needs to be evaluated in the ER.  The patient is typically seen at [facility] and her granddaughter has agreed to take her there via POV.  She left in stable condition.   Final Clinical Impressions(s) / UC Diagnoses   Final diagnoses:  Fall, initial encounter  Nausea and vomiting, unspecified vomiting type  Dehydration     Discharge Instructions      Please go to the emergency department at [facility] for evaluation following your fall.  Given that you are on a blood thinner you do need to have a CT scan of your head.  The fact that you are having a headache, nausea, and dry heaving is concerning for possible developing intracranial process.  There is also concern given your deep respirations and dry oral mucous membranes that you may be suffering from dehydration.     ED Prescriptions   None    PDMP not reviewed this encounter.   Becky Augusta, NP 01/05/23 [phone]

## 2023-01-05 NOTE — Progress Notes (Signed)
PHARMACY -  BRIEF ANTIBIOTIC NOTE   Pharmacy has received consult(s) for Cefepime & Vancomycin from an ED provider.  The patient's profile has been reviewed for ht / wt / allergies / indication / available labs.    One time order(s) placed for Cefepime 2 gm and Vancomycin 1500 mg per pt wt of 69.9 kg from O/V note from 11/20/22  Further antibiotics/pharmacy consults should be ordered by admitting physician if indicated.                       Thank you, Otelia Sergeant, PharmD, [facility] 01/05/2023 11:32 PM

## 2023-01-05 NOTE — Discharge Instructions (Signed)
Please go to the emergency department at [facility] for evaluation following your fall.  Given that you are on a blood thinner you do need to have a CT scan of your head.  The fact that you are having a headache, nausea, and dry heaving is concerning for possible developing intracranial process.  There is also concern given your deep respirations and dry oral mucous membranes that you may be suffering from dehydration.

## 2023-01-05 NOTE — ED Triage Notes (Signed)
Pt lives alone and fell 2 days ago. A family member found her on the floor. She has pain in her right lower back. She has a headache, has nausea and dry heaving. Her granddaughter reports she has dementia and has not eaten much the past few days.

## 2023-01-05 NOTE — ED Triage Notes (Signed)
Pt fell 3 days ago, unwitnessed fall, on thinners, now having nausea/vomiting. Now c/o of R hip pain, pt has dementia and family concerned that she may have UTI

## 2023-01-05 NOTE — ED Notes (Signed)
Patient is being discharged from the Urgent Care and sent to the Emergency Department via personal vehicle . Per Berneta Sages NP, patient is in need of higher level of care due to unwitnessed fall, headache, vomiting, Hip pain. Patient is aware and verbalizes understanding of plan of care.  Vitals:   01/05/23 1921  BP: 126/78  Pulse: 92  Resp: 16  Temp: 98.4 F (36.9 C)  SpO2: 100%

## 2023-01-06 ENCOUNTER — Other Ambulatory Visit: Payer: Self-pay

## 2023-01-06 DIAGNOSIS — R7989 Other specified abnormal findings of blood chemistry: Secondary | ICD-10-CM

## 2023-01-06 DIAGNOSIS — R509 Fever, unspecified: Secondary | ICD-10-CM | POA: Diagnosis not present

## 2023-01-06 DIAGNOSIS — Z951 Presence of aortocoronary bypass graft: Secondary | ICD-10-CM | POA: Diagnosis not present

## 2023-01-06 DIAGNOSIS — R1030 Lower abdominal pain, unspecified: Secondary | ICD-10-CM | POA: Diagnosis not present

## 2023-01-06 DIAGNOSIS — E1122 Type 2 diabetes mellitus with diabetic chronic kidney disease: Secondary | ICD-10-CM | POA: Diagnosis present

## 2023-01-06 DIAGNOSIS — W1830XA Fall on same level, unspecified, initial encounter: Secondary | ICD-10-CM | POA: Diagnosis present

## 2023-01-06 DIAGNOSIS — J449 Chronic obstructive pulmonary disease, unspecified: Secondary | ICD-10-CM | POA: Insufficient documentation

## 2023-01-06 DIAGNOSIS — E119 Type 2 diabetes mellitus without complications: Secondary | ICD-10-CM

## 2023-01-06 DIAGNOSIS — R531 Weakness: Secondary | ICD-10-CM | POA: Diagnosis present

## 2023-01-06 DIAGNOSIS — I10 Essential (primary) hypertension: Secondary | ICD-10-CM | POA: Diagnosis not present

## 2023-01-06 DIAGNOSIS — W19XXXA Unspecified fall, initial encounter: Secondary | ICD-10-CM | POA: Diagnosis present

## 2023-01-06 DIAGNOSIS — I714 Abdominal aortic aneurysm, without rupture, unspecified: Secondary | ICD-10-CM | POA: Diagnosis present

## 2023-01-06 DIAGNOSIS — R110 Nausea: Secondary | ICD-10-CM

## 2023-01-06 DIAGNOSIS — R6510 Systemic inflammatory response syndrome (SIRS) of non-infectious origin without acute organ dysfunction: Secondary | ICD-10-CM

## 2023-01-06 DIAGNOSIS — I429 Cardiomyopathy, unspecified: Secondary | ICD-10-CM | POA: Diagnosis present

## 2023-01-06 DIAGNOSIS — Z96653 Presence of artificial knee joint, bilateral: Secondary | ICD-10-CM | POA: Diagnosis present

## 2023-01-06 DIAGNOSIS — R54 Age-related physical debility: Secondary | ICD-10-CM

## 2023-01-06 DIAGNOSIS — I495 Sick sinus syndrome: Secondary | ICD-10-CM | POA: Diagnosis present

## 2023-01-06 DIAGNOSIS — K269 Duodenal ulcer, unspecified as acute or chronic, without hemorrhage or perforation: Secondary | ICD-10-CM | POA: Diagnosis present

## 2023-01-06 DIAGNOSIS — I2510 Atherosclerotic heart disease of native coronary artery without angina pectoris: Secondary | ICD-10-CM | POA: Diagnosis present

## 2023-01-06 DIAGNOSIS — R000 Tachycardia, unspecified: Secondary | ICD-10-CM

## 2023-01-06 DIAGNOSIS — I130 Hypertensive heart and chronic kidney disease with heart failure and stage 1 through stage 4 chronic kidney disease, or unspecified chronic kidney disease: Secondary | ICD-10-CM | POA: Diagnosis present

## 2023-01-06 DIAGNOSIS — I4891 Unspecified atrial fibrillation: Secondary | ICD-10-CM | POA: Diagnosis present

## 2023-01-06 DIAGNOSIS — I4821 Permanent atrial fibrillation: Secondary | ICD-10-CM | POA: Diagnosis not present

## 2023-01-06 DIAGNOSIS — I959 Hypotension, unspecified: Secondary | ICD-10-CM | POA: Diagnosis present

## 2023-01-06 DIAGNOSIS — F0390 Unspecified dementia without behavioral disturbance: Secondary | ICD-10-CM | POA: Diagnosis present

## 2023-01-06 DIAGNOSIS — Z7984 Long term (current) use of oral hypoglycemic drugs: Secondary | ICD-10-CM | POA: Diagnosis not present

## 2023-01-06 DIAGNOSIS — I5022 Chronic systolic (congestive) heart failure: Secondary | ICD-10-CM | POA: Diagnosis present

## 2023-01-06 DIAGNOSIS — Z7901 Long term (current) use of anticoagulants: Secondary | ICD-10-CM | POA: Diagnosis not present

## 2023-01-06 DIAGNOSIS — Z1152 Encounter for screening for COVID-19: Secondary | ICD-10-CM | POA: Diagnosis not present

## 2023-01-06 DIAGNOSIS — R400 Somnolence: Secondary | ICD-10-CM | POA: Diagnosis present

## 2023-01-06 DIAGNOSIS — F17210 Nicotine dependence, cigarettes, uncomplicated: Secondary | ICD-10-CM | POA: Diagnosis present

## 2023-01-06 DIAGNOSIS — N189 Chronic kidney disease, unspecified: Secondary | ICD-10-CM | POA: Diagnosis present

## 2023-01-06 DIAGNOSIS — Z950 Presence of cardiac pacemaker: Secondary | ICD-10-CM | POA: Diagnosis not present

## 2023-01-06 DIAGNOSIS — I2489 Other forms of acute ischemic heart disease: Secondary | ICD-10-CM | POA: Diagnosis present

## 2023-01-06 DIAGNOSIS — Z8711 Personal history of peptic ulcer disease: Secondary | ICD-10-CM

## 2023-01-06 LAB — GLUCOSE, CAPILLARY
Glucose-Capillary: 99 mg/dL (ref 70–99)
Glucose-Capillary: 92 mg/dL (ref 70–99)
Glucose-Capillary: 129 mg/dL — ABNORMAL HIGH (ref 70–99)
Glucose-Capillary: 77 mg/dL (ref 70–99)
Glucose-Capillary: 86 mg/dL (ref 70–99)

## 2023-01-06 LAB — PROTIME-INR
INR: 2.2 — ABNORMAL HIGH (ref 0.8–1.2)
Prothrombin Time: 24.3 seconds — ABNORMAL HIGH (ref 11.4–15.2)

## 2023-01-06 LAB — PROCALCITONIN: Procalcitonin: <0.1 ng/mL

## 2023-01-06 LAB — TROPONIN I (HIGH SENSITIVITY)
Troponin I (High Sensitivity): 145 ng/L (ref <18)
Troponin I (High Sensitivity): 162 ng/L (ref <18)

## 2023-01-06 LAB — CBC
HCT: 29.6 % — ABNORMAL LOW (ref 36.0–46.0)
Hemoglobin: 9 g/dL — ABNORMAL LOW (ref 12.0–15.0)
MCH: 27 pg (ref 26.0–34.0)
MCHC: 30.4 g/dL (ref 30.0–36.0)
MCV: 88.9 fL (ref 80.0–100.0)
Platelets: 187 10*3/uL (ref 150–400)
RBC: 3.33 MIL/uL — ABNORMAL LOW (ref 3.87–5.11)
RDW: 14.6 % (ref 11.5–15.5)
WBC: 6.4 10*3/uL (ref 4.0–10.5)
nRBC: 0 % (ref 0.0–0.2)

## 2023-01-06 LAB — LACTIC ACID, PLASMA
Lactic Acid, Venous: 1.2 mmol/L (ref 0.5–1.9)
Lactic Acid, Venous: 0.7 mmol/L (ref 0.5–1.9)

## 2023-01-06 LAB — CK: Total CK: 181 U/L (ref 38–234)

## 2023-01-06 LAB — SARS CORONAVIRUS 2 BY RT PCR: SARS Coronavirus 2 by RT PCR: NEGATIVE

## 2023-01-06 LAB — CORTISOL-AM, BLOOD: Cortisol - AM: 7.9 ug/dL (ref 6.7–22.6)

## 2023-01-06 MED ORDER — SERTRALINE HCL 50 MG PO TABS
50.0000 mg | ORAL_TABLET | Freq: Every morning | ORAL | Status: DC
  Administered 2023-01-06 – 2023-01-08 (×3): 50 mg via ORAL
  Filled 2023-01-06 (×3): qty 1

## 2023-01-06 MED ORDER — INSULIN ASPART 100 UNIT/ML IJ SOLN: 0.0000 [IU] | Freq: Every day | INTRAMUSCULAR | Status: DC

## 2023-01-06 MED ORDER — ONDANSETRON HCL 4 MG/2ML IJ SOLN
4.0000 mg | Freq: Four times a day (QID) | INTRAMUSCULAR | Status: DC | PRN
  Administered 2023-01-06: 4 mg via INTRAVENOUS
  Filled 2023-01-06: qty 2

## 2023-01-06 MED ORDER — ROSUVASTATIN CALCIUM 20 MG PO TABS
40.0000 mg | ORAL_TABLET | Freq: Every day | ORAL | Status: DC
  Administered 2023-01-06 – 2023-01-08 (×3): 40 mg via ORAL
  Filled 2023-01-06: qty 4
  Filled 2023-01-06 (×2): qty 2
  Filled 2023-01-06 (×2): qty 4
  Filled 2023-01-06: qty 2

## 2023-01-06 MED ORDER — DONEPEZIL HCL 5 MG PO TABS
5.0000 mg | ORAL_TABLET | Freq: Every day | ORAL | Status: DC
  Administered 2023-01-06: 5 mg via ORAL
  Filled 2023-01-06: qty 1

## 2023-01-06 MED ORDER — ONDANSETRON HCL 4 MG PO TABS: 4.0000 mg | ORAL_TABLET | Freq: Four times a day (QID) | ORAL | Status: DC | PRN

## 2023-01-06 MED ORDER — HYDROCODONE-ACETAMINOPHEN 5-325 MG PO TABS: 1.0000 | ORAL_TABLET | ORAL | Status: DC | PRN

## 2023-01-06 MED ORDER — METRONIDAZOLE 500 MG/100ML IV SOLN
500.0000 mg | Freq: Two times a day (BID) | INTRAVENOUS | Status: DC
  Filled 2023-01-06: qty 100

## 2023-01-06 MED ORDER — SODIUM CHLORIDE 0.9 % IV SOLN: 2.0000 g | INTRAVENOUS | Status: DC

## 2023-01-06 MED ORDER — DIPHENHYDRAMINE HCL 25 MG PO CAPS
25.0000 mg | ORAL_CAPSULE | Freq: Four times a day (QID) | ORAL | Status: DC | PRN
  Administered 2023-01-06: 25 mg via ORAL
  Filled 2023-01-06: qty 1

## 2023-01-06 MED ORDER — LACTATED RINGERS IV SOLN
150.0000 mL/h | INTRAVENOUS | Status: DC
  Administered 2023-01-06 (×2): 150 mL/h via INTRAVENOUS

## 2023-01-06 MED ORDER — APIXABAN 5 MG PO TABS
5.0000 mg | ORAL_TABLET | Freq: Two times a day (BID) | ORAL | Status: DC
  Administered 2023-01-06 – 2023-01-08 (×5): 5 mg via ORAL
  Filled 2023-01-06 (×5): qty 1

## 2023-01-06 MED ORDER — VANCOMYCIN HCL 500 MG/100ML IV SOLN: 500.0000 mg | INTRAVENOUS | Status: DC

## 2023-01-06 MED ORDER — PANTOPRAZOLE SODIUM 40 MG PO TBEC
40.0000 mg | DELAYED_RELEASE_TABLET | Freq: Two times a day (BID) | ORAL | Status: DC
  Administered 2023-01-06 – 2023-01-08 (×5): 40 mg via ORAL
  Filled 2023-01-06 (×5): qty 1

## 2023-01-06 MED ORDER — ALBUTEROL SULFATE HFA 108 (90 BASE) MCG/ACT IN AERS: 1.0000 | INHALATION_SPRAY | Freq: Four times a day (QID) | RESPIRATORY_TRACT | Status: DC | PRN

## 2023-01-06 MED ORDER — SODIUM CHLORIDE 0.9 % IV BOLUS
500.0000 mL | Freq: Once | INTRAVENOUS | Status: AC
  Administered 2023-01-06: 500 mL via INTRAVENOUS

## 2023-01-06 MED ORDER — ACETAMINOPHEN 325 MG PO TABS
650.0000 mg | ORAL_TABLET | Freq: Four times a day (QID) | ORAL | Status: DC | PRN
  Administered 2023-01-08: 650 mg via ORAL
  Filled 2023-01-06: qty 2

## 2023-01-06 MED ORDER — LACTATED RINGERS IV BOLUS (SEPSIS)
1000.0000 mL | Freq: Once | INTRAVENOUS | Status: DC
Start: 2023-01-06 — End: 2023-01-06
  Administered 2023-01-06: 1000 mL via INTRAVENOUS

## 2023-01-06 MED ORDER — INSULIN ASPART 100 UNIT/ML IJ SOLN
0.0000 [IU] | Freq: Three times a day (TID) | INTRAMUSCULAR | Status: DC
  Administered 2023-01-06 – 2023-01-08 (×2): 2 [IU] via SUBCUTANEOUS
  Filled 2023-01-06 (×2): qty 1

## 2023-01-06 MED ORDER — ACETAMINOPHEN 325 MG RE SUPP: 650.0000 mg | Freq: Four times a day (QID) | RECTAL | Status: DC | PRN

## 2023-01-06 MED ORDER — ALBUTEROL SULFATE (2.5 MG/3ML) 0.083% IN NEBU: 2.5000 mg | INHALATION_SOLUTION | Freq: Four times a day (QID) | RESPIRATORY_TRACT | Status: DC | PRN

## 2023-01-06 NOTE — Plan of Care (Signed)

## 2023-01-06 NOTE — Progress Notes (Signed)
Pharmacy Antibiotic Note  Jody Stewart is a 77 y.o. female admitted on 01/05/2023 with infection of unknown source.  Pharmacy has been consulted for Cefepime & Vancomycin dosing for 7 days.  Plan: Cefepime 2 gm q24hr per indication & renal fxn.  Pt given Vancomycin 1500 mg once. Vancomycin 500 mg IV Q 24 hrs. Goal AUC 400-550. Expected AUC: 486.5 SCr used: 1.65, Vd used: 0.72  Pharmacy will continue to follow and will adjust abx dosing whenever warranted.  Temp (24hrs), Avg:98.8 F (37.1 C), Min:98.4 F (36.9 C), Max:99 F (37.2 C)   Recent Labs  Lab 01/05/23 2007  WBC 7.4  CREATININE 1.65*    CrCl cannot be calculated (Unknown ideal weight.).    No Known Allergies  Antimicrobials this admission: 7/09 Cefepime >> x 7 days 7/09 Vancomycin >> x 7 days 7/09 Flagyl >> x 7 days  Microbiology results: 07/09 BCx: Pending  Thank you for allowing pharmacy to be a part of this patient's care.  Otelia Sergeant, PharmD, [facility] 01/06/2023 1:34 AM

## 2023-01-06 NOTE — Assessment & Plan Note (Signed)
Hemoglobin 10.5, down from 11.7 about 10 months prior Serial H&H given history of GI bleed and presentation with abdominal pain and nausea Continue home Protonix

## 2023-01-06 NOTE — Assessment & Plan Note (Addendum)
Fall Dementia Elderly female with poor oral intake and falls PT eval Follow-up CK to evaluate for rhabdo Continue Aricept Delirium precautions

## 2023-01-06 NOTE — Plan of Care (Signed)
  Problem: Clinical Measurements: Goal: Ability to maintain clinical measurements within normal limits will improve Outcome: Progressing   Problem: Activity: Goal: Risk for activity intolerance will decrease Outcome: Progressing   Problem: Pain Managment: Goal: General experience of comfort will improve Outcome: Progressing   Problem: Safety: Goal: Ability to remain free from injury will improve Outcome: Progressing   Problem: Skin Integrity: Goal: Risk for impaired skin integrity will decrease Outcome: Progressing   

## 2023-01-06 NOTE — Assessment & Plan Note (Signed)
Sliding scale insulin coverage 

## 2023-01-06 NOTE — Assessment & Plan Note (Addendum)
Heart rate in the 1 teens to 120s Possibly triggered by dehydration versus SIRS/sepsis Trial of IV hydration.  Soft blood pressures so unable to tolerate home Toprol  Continue home Eliquis

## 2023-01-06 NOTE — Assessment & Plan Note (Signed)
CT abdomen and pelvis unrevealing Symptom control Clear liquids to advance as tolerated IV hydration

## 2023-01-06 NOTE — Assessment & Plan Note (Addendum)
CAD s/p CABG Chronic systolic CHF (EF 78% 09/2022) S/p pacemaker/AICD for SSS Troponin 140.  EKG showing A-fib Patient denies chest pain Suspect demand ischemia from rapid A-fib and acute illness Continue to trend Monitor for fluid overload with IV fluid resuscitation for possible sepsis

## 2023-01-06 NOTE — Plan of Care (Signed)
  Problem: Respiratory: Goal: Ability to maintain adequate ventilation will improve Outcome: Progressing   Problem: Coping: Goal: Ability to adjust to condition or change in health will improve Outcome: Progressing   Problem: Pain Managment: Goal: General experience of comfort will improve Outcome: Progressing

## 2023-01-06 NOTE — Assessment & Plan Note (Signed)
Not acutely exacerbated DuoNebs as needed 

## 2023-01-06 NOTE — Assessment & Plan Note (Addendum)
Possible sepsis Patient with low-grade temp of 99 and tachycardic with soft blood pressure Symptomatic for nausea lower abdominal pain and headache, poor oral intake, weakness Lab and imaging unrevealing for infectious source Will continue broad-spectrum antibiotics for possible sepsis Sepsis fluids Follow cultures

## 2023-01-06 NOTE — H&P (Signed)
History and Physical    Patient: Jody Stewart ZOX:[id number] DOB: 1945-07-30 DOA: 01/05/2023 DOS: the patient was seen and examined on 01/06/2023 PCP: Etheleen Nicks, NP  Patient coming from: Home  Chief Complaint:  Chief Complaint  Patient presents with   Fall    HPI: Jody Stewart is a 77 y.o. female with medical history significant for diabetes, hypertension, stroke, CAD s/p CABG, pacemaker for SSS, systolic CHF (EF 81% 09/2022) atrial fibrillation on Eliquis, dementia, COPD who presents to the ED with generalized weakness since falling a couple days prior when she was unable to get up staying on the ground for over 20 minutes.  Since that time she has been more somnolent and has had decreased oral intake.  On the day of arrival she complained of a mild headache, lower abdominal pain and had some dry heaving.  She has had no cough, shortness of breath, fever or chills and no diarrhea.  Of note, chart review reveals that patient was hospitalized at [facility] in March 2024 with coffee-ground emesis in the setting of regular naproxen use.  Endoscopy revealed 2 duodenal ulcers s/p hemoclipping.   ED course and data review: On arrival temperature 99 with pulse 106 BP 133/96 that trended down to 83/68, fluid responsive to 101/72.  Labs notable for normal WBC.  No lactic acid done.Creatinine at baseline at 1.65.  Troponin 140.  Urinalysis with rare bacteria. EKG, personally viewed and interpreted showing A-fib at 119 Multiple imaging studies were done including CT head, x-ray hip, chest x-ray and CT abdomen and pelvis all of which were nonrevealing.  Patient was ordered a fluid bolus and started on cefepime and metronidazole for sepsis of suspected intra-abdominal source. Hospitalist consulted for admission.     Past Medical History:  Diagnosis Date   Arthritis    Cancer (HCC)    skin ca   COPD (chronic obstructive pulmonary disease) (HCC)    Diabetes mellitus without complication (HCC)    Edema     GERD (gastroesophageal reflux disease)    Hypertension    Palpitations    Stroke [facility]) 2004   tia   Past Surgical History:  Procedure Laterality Date   ABDOMINAL HYSTERECTOMY     CATARACT EXTRACTION W/PHACO Left 05/30/2015   Procedure: CATARACT EXTRACTION PHACO AND INTRAOCULAR LENS PLACEMENT (IOC);  Surgeon: Lia Hopping, MD;  Location: [facility] ORS;  Service: Ophthalmology;  Laterality: Left;  [place]    1:46.3 AP      11.5 CDE    12.15 casette lot #[id number]   CORONARY ARTERY BYPASS GRAFT  2014   JOINT REPLACEMENT  [id number]   bil tkr   OPEN REDUCTION INTERNAL FIXATION (ORIF) DISTAL RADIAL FRACTURE Right 08/06/2016   Procedure: OPEN REDUCTION INTERNAL FIXATION (ORIF) DISTAL RADIAL FRACTURE;  Surgeon: Christena Flake, MD;  Location: [facility] ORS;  Service: Orthopedics;  Laterality: Right;   Social History:  reports that she has quit smoking. Her smoking use included cigarettes. She smoked an average of .5 packs per day. She has never used smokeless tobacco. She reports that she does not drink alcohol and does not use drugs.  No Known Allergies  Family History  Problem Relation Age of Onset   Breast cancer Neg Hx     Prior to Admission medications   Medication Sig Start Date End Date Taking? Authorizing Provider  acetaminophen (TYLENOL) 650 MG CR tablet Take 650 mg by mouth every 8 (eight) hours as needed for pain.    [provider]  albuterol (PROVENTIL HFA;VENTOLIN HFA) 108 (90 BASE) MCG/ACT inhaler Inhale 1-2 puffs into the lungs every 6 (six) hours as needed for wheezing or shortness of breath.     [provider]  amLODipine (NORVASC) 5 MG tablet Take 5 mg by mouth daily. 06/02/16 12/22/22  [provider]  aspirin EC 81 MG tablet Take 81 mg by mouth daily.    [provider]  donepezil (ARICEPT) 5 MG tablet Take by mouth. 11/10/21   [provider]  ELIQUIS 5 MG TABS tablet Take 5 mg by mouth 2 (two) times daily.    [provider]   furosemide (LASIX) 20 MG tablet Take 20 mg by mouth daily.    [provider]  lisinopril (ZESTRIL) 20 MG tablet Take 20 mg by mouth daily. 02/04/21   [provider]  metFORMIN (GLUCOPHAGE) 500 MG tablet Take 500 mg by mouth 2 (two) times daily.     [provider]  metoprolol succinate (TOPROL-XL) 25 MG 24 hr tablet Take 1 tablet by mouth daily. 10/01/22 11/30/23  [provider]  mupirocin cream (BACTROBAN) 2 % AAA BID until healed. 04/30/19   Verlee Monte, NP  oxybutynin (DITROPAN) 5 MG tablet Take 5 mg by mouth daily. 01/20/21   [provider]  pantoprazole (PROTONIX) 40 MG tablet Take 40 mg by mouth 2 (two) times daily.    [provider]  rosuvastatin (CRESTOR) 40 MG tablet Take 40 mg by mouth daily.    [provider]  sertraline (ZOLOFT) 25 MG tablet Take 25 mg by mouth every morning. 01/20/21   [provider]  traMADol (ULTRAM) 50 MG tablet Take 1 tablet (50 mg total) by mouth every 12 (twelve) hours as needed for moderate pain or severe pain. 03/13/21   Tommie Sams, DO    Physical Exam: Vitals:   01/05/23 2200 01/05/23 2235 01/05/23 2315 01/05/23 2338  BP: 129/78 93/73 (!) 83/68   Pulse: 93 (!) 105 88   Resp: (!) 23 13 16    Temp:    99 F (37.2 C)  TempSrc:    Oral  SpO2: 98% 100% 94%    Physical Exam Vitals and nursing note reviewed.  Constitutional:      General: She is not in acute distress.    Comments: Frail-appearing elderly female  HENT:     Head: Normocephalic and atraumatic.  Cardiovascular:     Rate and Rhythm: Normal rate and regular rhythm.     Heart sounds: Normal heart sounds.  Pulmonary:     Effort: Pulmonary effort is normal.     Breath sounds: Normal breath sounds.  Abdominal:     Palpations: Abdomen is soft.     Tenderness: There is no abdominal tenderness.  Neurological:     Mental Status: Mental status is at baseline.     Labs on Admission: I have personally reviewed  following labs and imaging studies  CBC: Recent Labs  Lab 01/05/23 2007  WBC 7.4  HGB 10.5*  HCT 33.9*  MCV 88.1  PLT 210   Basic Metabolic Panel: Recent Labs  Lab 01/05/23 2007  NA 136  K 3.7  CL 105  CO2 20*  GLUCOSE 132*  BUN 23  CREATININE 1.65*  CALCIUM 9.0   GFR: CrCl cannot be calculated (Unknown ideal weight.). Liver Function Tests: Recent Labs  Lab 01/05/23 2137  AST 23  ALT 13  ALKPHOS 79  BILITOT 0.9  PROT 7.6  ALBUMIN 4.0  No results for input(s): "LIPASE", "AMYLASE" in the last 168 hours. No results for input(s): "AMMONIA" in the last 168 hours. Coagulation Profile: No results for input(s): "INR", "PROTIME" in the last 168 hours. Cardiac Enzymes: No results for input(s): "CKTOTAL", "CKMB", "CKMBINDEX", "TROPONINI" in the last 168 hours. BNP (last 3 results) No results for input(s): "PROBNP" in the last 8760 hours. HbA1C: No results for input(s): "HGBA1C" in the last 72 hours. CBG: No results for input(s): "GLUCAP" in the last 168 hours. Lipid Profile: No results for input(s): "CHOL", "HDL", "LDLCALC", "TRIG", "CHOLHDL", "LDLDIRECT" in the last 72 hours. Thyroid Function Tests: No results for input(s): "TSH", "T4TOTAL", "FREET4", "T3FREE", "THYROIDAB" in the last 72 hours. Anemia Panel: No results for input(s): "VITAMINB12", "FOLATE", "FERRITIN", "TIBC", "IRON", "RETICCTPCT" in the last 72 hours. Urine analysis:    Component Value Date/Time   COLORURINE YELLOW (A) 01/05/2023 2200   APPEARANCEUR CLEAR (A) 01/05/2023 2200   APPEARANCEUR Turbid 05/14/2012 2119   LABSPEC 1.020 01/05/2023 2200   LABSPEC 1.025 05/14/2012 2119   PHURINE 6.0 01/05/2023 2200   GLUCOSEU NEGATIVE 01/05/2023 2200   GLUCOSEU Negative 05/14/2012 2119   HGBUR NEGATIVE 01/05/2023 2200   BILIRUBINUR NEGATIVE 01/05/2023 2200   BILIRUBINUR Negative 05/14/2012 2119   KETONESUR NEGATIVE 01/05/2023 2200   PROTEINUR 100 (A) 01/05/2023 2200   NITRITE NEGATIVE 01/05/2023  2200   LEUKOCYTESUR NEGATIVE 01/05/2023 2200   LEUKOCYTESUR Negative 05/14/2012 2119    Radiological Exams on Admission: CT ABDOMEN PELVIS WO CONTRAST  Result Date: 01/05/2023 CLINICAL DATA:  Abdominal pain. EXAM: CT ABDOMEN AND PELVIS WITHOUT CONTRAST TECHNIQUE: Multidetector CT imaging of the abdomen and pelvis was performed following the standard protocol without IV contrast. RADIATION DOSE REDUCTION: This exam was performed according to the departmental dose-optimization program which includes automated exposure control, adjustment of the mA and/or kV according to patient size and/or use of iterative reconstruction technique. COMPARISON:  None Available. FINDINGS: Evaluation of this exam is limited in the absence of intravenous contrast. Lower chest: Left lung base linear atelectasis/scarring. The visualized lung bases are otherwise clear. There is mild cardiomegaly. Coronary vascular calcification and calcification of the mitral annulus. A cardiac pacer noted. No intra-abdominal free air or free fluid. Hepatobiliary: The liver is unremarkable. No biliary dilatation. Several large stones within the gallbladder. No pericholecystic fluid or evidence of acute cholecystitis by CT. Ultrasound may provide better evaluation of the gallbladder if there is clinical concern for acute cholecystitis. Pancreas: The pancreas is unremarkable.  No active inflammation. Spleen: Normal in size without focal abnormality. Adrenals/Urinary Tract: The adrenal glands unremarkable. Vascular calcifications versus less likely tiny nonobstructing bilateral renal calculi. There is no hydronephrosis on either side. A 1 cm exophytic lesion arising from the upper pole of the right kidney is not characterized on this CT. Further evaluation with ultrasound on a nonemergent/outpatient basis recommended. The visualized ureters and urinary bladder appear unremarkable. Stomach/Bowel: There is sigmoid diverticulosis without active inflammatory  changes. There is no bowel obstruction or active inflammation. The appendix is normal. Vascular/Lymphatic: Advanced aortoiliac atherosclerotic disease. The IVC is unremarkable. No portal venous gas. There is no adenopathy. Reproductive: Hysterectomy.  No adnexal masses. Other: None Musculoskeletal: Osteopenia with degenerative changes of the spine. No acute osseous pathology. IMPRESSION: 1. No acute intra-abdominal or pelvic pathology. 2. Cholelithiasis. 3. Sigmoid diverticulosis. No bowel obstruction. Normal appendix. 4.  Aortic Atherosclerosis (ICD10-I70.0). Electronically Signed   By: Elgie Collard M.D.   On: 01/05/2023 23:33   DG Chest Portable 1 View  Result  Date: 01/05/2023 CLINICAL DATA:  Fall, weakness. EXAM: PORTABLE CHEST 1 VIEW COMPARISON:  04/15/2022 FINDINGS: Prior median sternotomy. Stable cardiomegaly with lead less pacemaker. Aortic atherosclerosis. Low lung volumes with bronchovascular crowding and likely vascular congestion. No large pleural effusion or confluent airspace disease. No pneumothorax. On limited assessment, no acute osseous findings. IMPRESSION: Low lung volumes with bronchovascular crowding and likely vascular congestion. Electronically Signed   By: Narda Rutherford M.D.   On: 01/05/2023 21:54   DG Hip Unilat  With Pelvis 2-3 Views Right  Result Date: 01/05/2023 CLINICAL DATA:  Fall.  Patient fell 3 days EXAM: DG HIP (WITH OR WITHOUT PELVIS) 2-3V RIGHT COMPARISON:  None Available. FINDINGS: There is no evidence of hip fracture or dislocation. Mild bilateral hip osteoarthritis. Degenerate disc disease of the lower lumbar spine. Prominent vascular calcifications. IMPRESSION: 1. No acute fracture or dislocation. 2. Mild bilateral hip osteoarthritis. Electronically Signed   By: Larose Hires D.O.   On: 01/05/2023 20:41   CT HEAD WO CONTRAST ( )  Result Date: 01/05/2023 CLINICAL DATA:  Head trauma EXAM: CT HEAD WITHOUT CONTRAST TECHNIQUE: Contiguous axial images were obtained  from the base of the skull through the vertex without intravenous contrast. RADIATION DOSE REDUCTION: This exam was performed according to the departmental dose-optimization program which includes automated exposure control, adjustment of the mA and/or kV according to patient size and/or use of iterative reconstruction technique. COMPARISON:  Head CT 04/15/2022 FINDINGS: Brain: No evidence of acute infarction, hemorrhage, hydrocephalus, extra-axial collection or mass lesion/mass effect. There is mild diffuse atrophy. There is extensive periventricular and deep white matter hypodensity which is similar to the prior study. There are old lacunar infarcts in the bilateral basal ganglia. Vascular: Atherosclerotic calcifications are present within the cavernous internal carotid arteries. Skull: Normal. Negative for fracture or focal lesion. Sinuses/Orbits: No acute finding. Other: None. IMPRESSION: 1. No acute intracranial process. 2. Stable atrophy and chronic small vessel ischemic changes. Electronically Signed   By: Darliss Cheney M.D.   On: 01/05/2023 20:37     Data Reviewed: Relevant notes from primary care and specialist visits, past discharge summaries as available in EHR, including Care Everywhere. Prior diagnostic testing as pertinent to current admission diagnoses Updated medications and problem lists for reconciliation ED course, including vitals, labs, imaging, treatment and response to treatment Triage notes, nursing and pharmacy notes and ED provider's notes Notable results as noted in HPI   Assessment and Plan: * SIRS (systemic inflammatory response syndrome) (HCC) Possible sepsis Patient with low-grade temp of 99 and tachycardic with soft blood pressure Symptomatic for nausea lower abdominal pain and headache, poor oral intake, weakness Lab and imaging unrevealing for infectious source Will continue broad-spectrum antibiotics for possible sepsis Sepsis fluids Follow cultures  Elevated  troponin CAD s/p CABG Chronic systolic CHF (EF 19% 09/2022) S/p pacemaker/AICD for SSS Troponin 140.  EKG showing A-fib Patient denies chest pain Suspect demand ischemia from rapid A-fib and acute illness Continue to trend Monitor for fluid overload with IV fluid resuscitation for possible sepsis  History of bleeding peptic ulcer 08/2022 Hemoglobin 10.5, down from 11.7 about 10 months prior Serial H&H given history of GI bleed and presentation with abdominal pain and nausea Continue home Protonix  Frailty Fall Dementia Elderly female with poor oral intake and falls PT eval Follow-up CK to evaluate for rhabdo Continue Aricept Delirium precautions  Nausea CT abdomen and pelvis unrevealing Symptom control Clear liquids to advance as tolerated IV hydration  Chronic atrial fibrillation with RVR (HCC)  Heart rate in the 1 teens to 120s Possibly triggered by dehydration versus SIRS/sepsis Trial of IV hydration.  Soft blood pressures so unable to tolerate home Toprol  Continue home Eliquis  Diabetes mellitus without complication (HCC) Sliding scale insulin coverage  Hypertension Hold antihypertensives due to hypotension  COPD (chronic obstructive pulmonary disease) (HCC) Not acutely exacerbated DuoNebs as needed    DVT prophylaxis: Eliquis  Consults: none  Advance Care Planning: full code  Family Communication: Son at bedside  Disposition Plan: Back to previous home environment  Severity of Illness: The appropriate patient status for this patient is INPATIENT. Inpatient status is judged to be reasonable and necessary in order to provide the required intensity of service to ensure the patient's safety. The patient's presenting symptoms, physical exam findings, and initial radiographic and laboratory data in the context of their chronic comorbidities is felt to place them at high risk for further clinical deterioration. Furthermore, it is not anticipated that the patient  will be medically stable for discharge from the hospital within 2 midnights of admission.   * I certify that at the point of admission it is my clinical judgment that the patient will require inpatient hospital care spanning beyond 2 midnights from the point of admission due to high intensity of service, high risk for further deterioration and high frequency of surveillance required.*  Author: Andris Baumann, MD 01/06/2023 12:49 AM  For on call review [email or web address].

## 2023-01-06 NOTE — Progress Notes (Signed)
Patient is seen and examined today morning. She is admitted for SIRS, possible sepsis, Afib with RVR. Started IV hydration, IV antibiotics. She is lying in bed, denies any complaints. Reviewed her chart, no evidence of infection. Stopped antibiotics. Troponin elevated, blood cultures pending. Called Cardiology for eval. Family wishes to take her home as her mental status improved.  Grand daughter she has dementia, acting at her baseline, eating better today wishes to take her home. Discussed with son over phone who states that her right leg is swollen more than left asks for DVT study, agrees for stay and cardiac evaluation.

## 2023-01-06 NOTE — Assessment & Plan Note (Signed)
Hold antihypertensives due to hypotension

## 2023-01-07 ENCOUNTER — Inpatient Hospital Stay: Payer: Medicare HMO

## 2023-01-07 DIAGNOSIS — W19XXXA Unspecified fall, initial encounter: Secondary | ICD-10-CM

## 2023-01-07 DIAGNOSIS — R6510 Systemic inflammatory response syndrome (SIRS) of non-infectious origin without acute organ dysfunction: Secondary | ICD-10-CM | POA: Diagnosis not present

## 2023-01-07 DIAGNOSIS — I482 Chronic atrial fibrillation, unspecified: Secondary | ICD-10-CM

## 2023-01-07 DIAGNOSIS — R7989 Other specified abnormal findings of blood chemistry: Secondary | ICD-10-CM | POA: Diagnosis not present

## 2023-01-07 DIAGNOSIS — I10 Essential (primary) hypertension: Secondary | ICD-10-CM

## 2023-01-07 DIAGNOSIS — I4821 Permanent atrial fibrillation: Secondary | ICD-10-CM

## 2023-01-07 DIAGNOSIS — R54 Age-related physical debility: Secondary | ICD-10-CM | POA: Diagnosis not present

## 2023-01-07 DIAGNOSIS — R531 Weakness: Secondary | ICD-10-CM

## 2023-01-07 DIAGNOSIS — Z8711 Personal history of peptic ulcer disease: Secondary | ICD-10-CM

## 2023-01-07 DIAGNOSIS — Z794 Long term (current) use of insulin: Secondary | ICD-10-CM

## 2023-01-07 DIAGNOSIS — R110 Nausea: Secondary | ICD-10-CM

## 2023-01-07 LAB — GLUCOSE, CAPILLARY
Glucose-Capillary: 73 mg/dL (ref 70–99)
Glucose-Capillary: 79 mg/dL (ref 70–99)
Glucose-Capillary: 114 mg/dL — ABNORMAL HIGH (ref 70–99)
Glucose-Capillary: 163 mg/dL — ABNORMAL HIGH (ref 70–99)

## 2023-01-07 LAB — BASIC METABOLIC PANEL
Anion gap: 6 (ref 5–15)
BUN: 14 mg/dL (ref 8–23)
CO2: 24 mmol/L (ref 22–32)
Calcium: 8.5 mg/dL — ABNORMAL LOW (ref 8.9–10.3)
Chloride: 111 mmol/L (ref 98–111)
Creatinine, Ser: 1.35 mg/dL — ABNORMAL HIGH (ref 0.44–1.00)
GFR, Estimated: 40 mL/min — ABNORMAL LOW (ref >60)
Glucose, Bld: 89 mg/dL (ref 70–99)
Potassium: 3.8 mmol/L (ref 3.5–5.1)
Sodium: 141 mmol/L (ref 135–145)

## 2023-01-07 LAB — CBC
HCT: 29.4 % — ABNORMAL LOW (ref 36.0–46.0)
Hemoglobin: 9.1 g/dL — ABNORMAL LOW (ref 12.0–15.0)
MCH: 27.2 pg (ref 26.0–34.0)
MCHC: 31 g/dL (ref 30.0–36.0)
MCV: 87.8 fL (ref 80.0–100.0)
Platelets: 181 10*3/uL (ref 150–400)
RBC: 3.35 MIL/uL — ABNORMAL LOW (ref 3.87–5.11)
RDW: 14.8 % (ref 11.5–15.5)
WBC: 4.6 10*3/uL (ref 4.0–10.5)
nRBC: 0 % (ref 0.0–0.2)

## 2023-01-07 LAB — TROPONIN I (HIGH SENSITIVITY): Troponin I (High Sensitivity): 191 ng/L (ref <18)

## 2023-01-07 LAB — HEMOGLOBIN A1C
Hgb A1c MFr Bld: 6.5 % — ABNORMAL HIGH (ref 4.8–5.6)
Mean Plasma Glucose: 140 mg/dL

## 2023-01-07 MED ORDER — METOPROLOL TARTRATE 25 MG PO TABS
12.5000 mg | ORAL_TABLET | Freq: Two times a day (BID) | ORAL | Status: DC
  Administered 2023-01-07 – 2023-01-08 (×3): 12.5 mg via ORAL
  Filled 2023-01-07 (×3): qty 1

## 2023-01-07 NOTE — Plan of Care (Signed)
  Problem: Clinical Measurements: Goal: Signs and symptoms of infection will decrease Outcome: Progressing   Problem: Respiratory: Goal: Ability to maintain adequate ventilation will improve 01/07/2023 0630 by Darrol Angel, RN Outcome: Progressing 01/06/2023 2326 by Darrol Angel, RN Outcome: Progressing   Problem: Coping: Goal: Ability to adjust to condition or change in health will improve Outcome: Progressing   Problem: Fluid Volume: Goal: Ability to maintain a balanced intake and output will improve Outcome: Progressing   Problem: Metabolic: Goal: Ability to maintain appropriate glucose levels will improve Outcome: Progressing   Problem: Skin Integrity: Goal: Risk for impaired skin integrity will decrease Outcome: Progressing   Problem: Tissue Perfusion: Goal: Adequacy of tissue perfusion will improve Outcome: Progressing   Problem: Pain Managment: Goal: General experience of comfort will improve 01/07/2023 0630 by Darrol Angel, RN Outcome: Progressing 01/06/2023 2326 by Darrol Angel, RN Outcome: Progressing   Problem: Safety: Goal: Ability to remain free from injury will improve Outcome: Progressing   Problem: Skin Integrity: Goal: Risk for impaired skin integrity will decrease Outcome: Progressing   Problem: Nutritional: Goal: Maintenance of adequate nutrition will improve Outcome: Not Progressing Goal: Progress toward achieving an optimal weight will improve Outcome: Not Progressing   Problem: Activity: Goal: Risk for activity intolerance will decrease Outcome: Not Progressing   Problem: Nutrition: Goal: Adequate nutrition will be maintained Outcome: Not Progressing

## 2023-01-07 NOTE — Progress Notes (Signed)
Nurse reached out to provider. Okay to keep purewick in place given the status of the patient.

## 2023-01-07 NOTE — Plan of Care (Signed)

## 2023-01-07 NOTE — Progress Notes (Signed)
  Progress Note   Patient: Jody Stewart JXB:[id number] DOB: September 18, 1945 DOA: 01/05/2023     1 DOS: the patient was seen and examined on 01/07/2023   Brief hospital course: Jody Stewart is a 77 y.o. female with medical history significant for diabetes, hypertension, stroke, CAD s/p CABG, pacemaker for SSS, systolic CHF (EF 13% 09/2022) atrial fibrillation on Eliquis, dementia, COPD who presents to the ED with generalized weakness since falling a couple days prior when she was unable to get up staying on the ground for over 20 minutes.  Since that time she has been more somnolent and has had decreased oral intake.    Assessment and Plan: * SIRS (systemic inflammatory response syndrome) (HCC) Possible sepsis Asymptomatic, afebrile.  HR elevated due to RVR, Blood pressure lower side but stable. Unlikely sepsis. Lab and imaging unrevealing for infectious source Will hold broad-spectrum antibiotics. Hold fluids due to her CHF. Follow cultures/ sensitivities.  Elevated troponin CAD s/p CABG Chronic systolic CHF (EF 08% 09/2022) S/p pacemaker/AICD for SSS Troponin 140-190.  EKG showed A-fib Patient denies chest pain This is possibly due to demand ischemia from rapid A-fib and acute illness Cardiology evaluation is appreciated.  Advised metoprolol therapy. Avoid IV fluids at this time.  Encourage oral fluids.  History of bleeding peptic ulcer 08/2022 Hemoglobin 10.5, down from 11.7 about 10 months prior Serial H&H given history of GI bleed and presentation with abdominal pain and nausea Continue home Protonix.  Patient is on Eliquis therapy.  Frailty Fall Dementia Elderly female with poor oral intake and falls PT evaluation for discharge planning Follow-up CK to evaluate for rhabdo Continue Aricept Delirium precautions  Nausea CT abdomen and pelvis unrevealing Continue antiemetics. Clear liquids to advance as tolerated  Chronic atrial fibrillation with RVR (HCC) Possibly triggered  by dehydration versus SIRS/sepsis Patient will be started on metoprolol 12.5 mg twice daily as per cardiology recommendation. Continue home Eliquis, monitor H&H given history of GI bleed, peptic ulcer  Diabetes mellitus without complication (HCC) Continue Accu-Cheks, sliding scale insulin coverage  Hypertension Hold antihypertensives due to hypotension  COPD (chronic obstructive pulmonary disease) (HCC) Not acutely exacerbated DuoNebs as needed  Possible discharge tomorrow if blood cultures remain negative, BP stable, heart rate improved      Subjective: Patient is lying comfortably.  She got Benadryl for itching last night has been sleeping, did not eat breakfast.  Son at bedside.  Physical Exam: Vitals:   01/06/23 0759 01/06/23 1534 01/06/23 2128 01/07/23 1034  BP: (!) 105/56 107/63 112/68 118/84  Pulse: (!) 109 98 (!) 103 (!) 105  Resp: 16 17 20 17   Temp: 98.9 F (37.2 C) 98.5 F (36.9 C) 97.6 F (36.4 C) 98 F (36.7 C)  TempSrc:      SpO2: 96% 93% 93% 90%  Weight:       General - Elderly looking Caucasian female, sleeping, no distress HEENT - PERRLA, EOMI, atraumatic head, non tender sinuses. Lung - Clear, bibasilar Rales. Heart - S1, S2 heard, no murmurs, rubs, trace pedal edema Neuro -sleepy and lethargic, unable to do full neuro exam. Skin - Warm and dry. Data Reviewed:  CBC, BMP, Troponin  Family Communication: His son at bedside updated regarding her care plan.   Disposition: Status is: Inpatient Remains inpatient appropriate because: elevated troponin, afib with RVR, low BP  Planned Discharge Destination: Home    Time spent: 44 minutes  Author: Marcelino Duster, MD 01/07/2023 2:41 PM  For on call review [email or web address].

## 2023-01-07 NOTE — Consult Note (Signed)
Cardiology Consultation   Patient ID: Jody Stewart MRN: [id number]; DOB: June 17, 1946  Admit date: 01/05/2023 Date of Consult: 01/07/2023  PCP:  Etheleen Nicks, NP   [facility] Providers Cardiologist:  None      New consult completed by Dr Mariah Milling  Patient Profile:   Jody Stewart is a 77 y.o. female with a hx of coronary artery disease status post CABG, diabetes, hypertension, CVA, pacemaker for sick sinus syndrome, HFrEF (EF 4% in 09/2022), atrial fibrillation on apixaban, dementia, COPD who is being seen 01/07/2023 for the evaluation of atrial fibrillation RVR at the request of Dr Clide Dales.  History of Present Illness:   Jody Stewart has had multiple hospitalizations and emergency department visits in the past year with recent evaluation in the emergency department at [facility] for right leg pain and right leg swelling.  She was also hospitalized in March 2024 at [facility] with coffee-ground emesis in the setting of regular naproxen use.  Enteroscopy revealed 2 duodenal ulcers status post hemoclipping.  She presented urgent care in [place] originally on 01/05/2023.  It was advised at that time send she had had a fall that was unwitnessed and it was undetermined if she had hit her head she needed to have a CT scan.  She presented to the [facility] emergency department on 01/05/2023 stating that she had fallen 2 days prior.  Family member found her on the floor.  She is complaining of right lower back pain, headache, nausea, dry heaving.  Her granddaughter accompanies her reported that her baseline was dementia.  It was estimated after conversation with family that she had been down for approximately 20 minutes.  Today after she slept most of the day.  Family stated that she been taking her medications which she had not been eating and only been drinking small amounts of fluids.  Approximately an hour prior to arrival she developed nausea with dry heaving.  She is on anticoagulant of apixaban for atrial  fibrillation.  Initial vital signs: Blood pressure 133/96, pulse 106, respirations 18, temperature 99  Pertinent labs: Hemoglobin 10.5, CO2 20, blood glucose 132, serum creatinine 1.65, GFR 32, high-sensitivity troponin 140, urinalysis with rare bacteria  Imaging: CT of the head revealed no acute intracranial process, stable atrophy and chronic small vessel ischemic changes; hip films revealed no acute fracture or dislocation but showed mild bilateral hip osteoarthritis; x-ray of the chest revealed low lung volumes with bronchovascular crowding and likely vascular congestion; CT of the abdomen pelvis revealed no acute intra-abdominal or pelvic pathology, cholelithiasis, sigmoid diverticulosis, and aortic atherosclerosis  Medications administered in the emergency department: Vancomycin 1500 mg IVPB, cefepime 2 g IVPB, IVF bolus  Cardiology consulted for patient on arrival.  Atrial fibrillation with rate of 119   Past Medical History:  Diagnosis Date   Arthritis    Cancer (HCC)    skin ca   COPD (chronic obstructive pulmonary disease) (HCC)    Diabetes mellitus without complication (HCC)    Edema    GERD (gastroesophageal reflux disease)    Hypertension    Palpitations    Stroke (HCC) 2004   tia    Past Surgical History:  Procedure Laterality Date   ABDOMINAL HYSTERECTOMY     CATARACT EXTRACTION W/PHACO Left 05/30/2015   Procedure: CATARACT EXTRACTION PHACO AND INTRAOCULAR LENS PLACEMENT (IOC);  Surgeon: Lia Hopping, MD;  Location: [facility] ORS;  Service: Ophthalmology;  Laterality: Left;  [place]    1:46.3 AP      11.5  CDE    12.15 casette lot #[id number]   CORONARY ARTERY BYPASS GRAFT  2014   JOINT REPLACEMENT  [id number]   bil tkr   OPEN REDUCTION INTERNAL FIXATION (ORIF) DISTAL RADIAL FRACTURE Right 08/06/2016   Procedure: OPEN REDUCTION INTERNAL FIXATION (ORIF) DISTAL RADIAL FRACTURE;  Surgeon: Christena Flake, MD;  Location: [facility] ORS;  Service: Orthopedics;  Laterality: Right;      Home Medications:  Prior to Admission medications   Medication Sig Start Date End Date Taking? Authorizing Provider  acetaminophen (TYLENOL) 650 MG CR tablet Take 650 mg by mouth every 8 (eight) hours as needed for pain.   Yes [provider]  aspirin EC 81 MG tablet Take 81 mg by mouth daily.   Yes [provider]  ELIQUIS 5 MG TABS tablet Take 5 mg by mouth 2 (two) times daily.   Yes [provider]  lisinopril (ZESTRIL) 20 MG tablet Take 20 mg by mouth daily. 02/04/21  Yes [provider]  metFORMIN (GLUCOPHAGE) 500 MG tablet Take 500 mg by mouth daily with breakfast.   Yes [provider]  metoprolol succinate (TOPROL-XL) 25 MG 24 hr tablet Take 25 mg by mouth daily. 10/01/22 11/30/23 Yes [provider]  oxybutynin (DITROPAN) 5 MG tablet Take 5 mg by mouth daily. 01/20/21  Yes [provider]  pantoprazole (PROTONIX) 40 MG tablet Take 40 mg by mouth 2 (two) times daily.   Yes [provider]  rosuvastatin (CRESTOR) 40 MG tablet Take 40 mg by mouth daily.   Yes [provider]  sertraline (ZOLOFT) 50 MG tablet Take 50 mg by mouth every morning. 01/20/21  Yes [provider]  albuterol (PROVENTIL HFA;VENTOLIN HFA) 108 (90 BASE) MCG/ACT inhaler Inhale 1-2 puffs into the lungs every 6 (six) hours as needed for wheezing or shortness of breath.  Patient not taking: Reported on 01/06/2023    [provider]  amLODipine (NORVASC) 5 MG tablet Take 5 mg by mouth daily. 06/02/16 12/22/22  [provider]  donepezil (ARICEPT) 5 MG tablet Take by mouth. Patient not taking: Reported on 01/06/2023 11/10/21   [provider]  furosemide (LASIX) 20 MG tablet Take 20 mg by mouth daily. Patient not taking: Reported on 01/06/2023    [provider]  mupirocin cream (BACTROBAN) 2 % AAA BID until healed. Patient not taking: Reported on 01/06/2023 04/30/19   Verlee Monte, NP  traMADol (ULTRAM) 50 MG  tablet Take 1 tablet (50 mg total) by mouth every 12 (twelve) hours as needed for moderate pain or severe pain. Patient not taking: Reported on 01/06/2023 03/13/21   Tommie Sams, DO    Inpatient Medications: Scheduled Meds:  apixaban  5 mg Oral BID   insulin aspart  0-15 Units Subcutaneous TID WC   insulin aspart  0-5 Units Subcutaneous QHS   pantoprazole  40 mg Oral BID   rosuvastatin  40 mg Oral Daily   sertraline  50 mg Oral q morning   Continuous Infusions:  PRN Meds: acetaminophen **OR** acetaminophen, albuterol, diphenhydrAMINE, HYDROcodone-acetaminophen, ondansetron **OR** ondansetron (ZOFRAN) IV  Allergies:   No Known Allergies  Social History:   Social History   Socioeconomic History   Marital status: Divorced    Spouse name: Not on file   Number of children: Not on file   Years of education: Not on file   Highest education level: Not on file  Occupational History   Not on file  Tobacco Use   Smoking  status: Former    Current packs/day: 0.50    Types: Cigarettes   Smokeless tobacco: Never  Vaping Use   Vaping status: Never Used  Substance and Sexual Activity   Alcohol use: No    Alcohol/week: 0.0 standard drinks of alcohol   Drug use: No   Sexual activity: Not on file  Other Topics Concern   Not on file  Social History Narrative   Not on file   Social Determinants of Health   Financial Resource Strain: Low Risk  (10/06/2022)   [facility] from [facility]   Overall Financial Resource Strain (CARDIA)    Difficulty of Paying Living Expenses: Not hard at all  Food Insecurity: No Food Insecurity (01/06/2023)   Hunger Vital Sign    Worried About Running Out of Food in the Last Year: Never true    Ran Out of Food in the Last Year: Never true  Transportation Needs: No Transportation Needs (01/06/2023)   PRAPARE - Administrator, Civil Service (Medical): No    Lack of Transportation (Non-Medical): No  Physical Activity: Not on  file  Stress: Not on file  Social Connections: Not on file  Intimate Partner Violence: Patient Unable To Answer (01/06/2023)   Humiliation, Afraid, Rape, and Kick questionnaire    Fear of Current or Ex-Partner: Patient unable to answer    Emotionally Abused: Patient unable to answer    Physically Abused: Patient unable to answer    Sexually Abused: Patient unable to answer    Family History:    Family History  Problem Relation Age of Onset   Breast cancer Neg Hx      ROS:  Please see the history of present illness.  Review of Systems  Musculoskeletal:  Positive for back pain, falls and joint pain.    All other ROS reviewed and negative.     Physical Exam/Data:   Vitals:   01/06/23 0759 01/06/23 1534 01/06/23 2128 01/07/23 1034  BP: (!) 105/56 107/63 112/68 118/84  Pulse: (!) 109 98 (!) 103 (!) 105  Resp: 16 17 20 17   Temp: 98.9 F (37.2 C) 98.5 F (36.9 C) 97.6 F (36.4 C) 98 F (36.7 C)  TempSrc:      SpO2: 96% 93% 93% 90%  Weight:        Intake/Output Summary (Last 24 hours) at 01/07/2023 1241 Last data filed at 01/07/2023 0547 Gross per 24 hour  Intake 950.52 ml  Output 1350 ml  Net -399.48 ml      01/06/2023    1:56 AM 12/22/2022    9:14 AM 04/15/2022    3:49 PM  Last 3 Weights  Weight (lbs) 154 lb 15.7 oz 150 lb 145 lb  Weight (kg) 70.3 kg 68.04 kg 65.772 kg     Body mass index is 31.3 kg/m.  General: Frail-appearing elderly female in no acute distress HEENT: normal Neck: no JVD Vascular: No carotid bruits; Distal pulses 2+ bilaterally Cardiac:  normal S1, S2; IR IR; no murmur  Lungs:  clear to auscultation bilaterally, no wheezing, rhonchi or rales  Abd: soft, nontender, no hepatomegaly  Ext: 1+ R>L edema Musculoskeletal:  No deformities, BUE and BLE strength normal and equal Skin: warm and dry  Neuro:  CNs 2-12 intact, no focal abnormalities noted Psych:  Normal affect, pleasant with baseline dementia  EKG:  The EKG was personally reviewed  and demonstrates: Atrial fibrillation with right bundle branch block with a rate of 119 Telemetry:  Telemetry was personally reviewed and demonstrates: Rate controlled atrial fibrillation with chronic right bundle branch block  Relevant CV Studies: 2D Echo completed at outside hospital 09/28/22 INTERPRETATION  NORMAL LEFT VENTRICULAR SYSTOLIC FUNCTION   WITH MODERATE LVH  NORMAL RIGHT VENTRICULAR SYSTOLIC FUNCTION  MILD VALVULAR REGURGITATION (See above)  NO VALVULAR STENOSIS  NO PERICARDIAL EFFUSION  MODERATE MAC  NO PRIOR STUDY FOR COMPARISON   Laboratory Data:  High Sensitivity Troponin:   Recent Labs  Lab 01/05/23 2137 01/06/23 0738 01/06/23 1320 01/07/23 0936  TROPONINIHS 140* 145* 162* 191*     Chemistry Recent Labs  Lab 01/05/23 2007 01/07/23 0936  NA 136 141  K 3.7 3.8  CL 105 111  CO2 20* 24  GLUCOSE 132* 89  BUN 23 14  CREATININE 1.65* 1.35*  CALCIUM 9.0 8.5*  GFRNONAA 32* 40*  ANIONGAP 11 6    Recent Labs  Lab 01/05/23 2137  PROT 7.6  ALBUMIN 4.0  AST 23  ALT 13  ALKPHOS 79  BILITOT 0.9   Lipids No results for input(s): "CHOL", "TRIG", "HDL", "LABVLDL", "LDLCALC", "CHOLHDL" in the last 168 hours.  Hematology Recent Labs  Lab 01/05/23 2007 01/06/23 0243 01/07/23 0936  WBC 7.4 6.4 4.6  RBC 3.85* 3.33* 3.35*  HGB 10.5* 9.0* 9.1*  HCT 33.9* 29.6* 29.4*  MCV 88.1 88.9 87.8  MCH 27.3 27.0 27.2  MCHC 31.0 30.4 31.0  RDW 14.5 14.6 14.8  PLT 210 187 181   Thyroid No results for input(s): "TSH", "FREET4" in the last 168 hours.  BNPNo results for input(s): "BNP", "PROBNP" in the last 168 hours.  DDimer No results for input(s): "DDIMER" in the last 168 hours.   Radiology/Studies:  CT ABDOMEN PELVIS WO CONTRAST  Result Date: 01/05/2023 CLINICAL DATA:  Abdominal pain. EXAM: CT ABDOMEN AND PELVIS WITHOUT CONTRAST TECHNIQUE: Multidetector CT imaging of the abdomen and pelvis was performed following the standard protocol without IV contrast.  RADIATION DOSE REDUCTION: This exam was performed according to the departmental dose-optimization program which includes automated exposure control, adjustment of the mA and/or kV according to patient size and/or use of iterative reconstruction technique. COMPARISON:  None Available. FINDINGS: Evaluation of this exam is limited in the absence of intravenous contrast. Lower chest: Left lung base linear atelectasis/scarring. The visualized lung bases are otherwise clear. There is mild cardiomegaly. Coronary vascular calcification and calcification of the mitral annulus. A cardiac pacer noted. No intra-abdominal free air or free fluid. Hepatobiliary: The liver is unremarkable. No biliary dilatation. Several large stones within the gallbladder. No pericholecystic fluid or evidence of acute cholecystitis by CT. Ultrasound may provide better evaluation of the gallbladder if there is clinical concern for acute cholecystitis. Pancreas: The pancreas is unremarkable.  No active inflammation. Spleen: Normal in size without focal abnormality. Adrenals/Urinary Tract: The adrenal glands unremarkable. Vascular calcifications versus less likely tiny nonobstructing bilateral renal calculi. There is no hydronephrosis on either side. A 1 cm exophytic lesion arising from the upper pole of the right kidney is not characterized on this CT. Further evaluation with ultrasound on a nonemergent/outpatient basis recommended. The visualized ureters and urinary bladder appear unremarkable. Stomach/Bowel: There is sigmoid diverticulosis without active inflammatory changes. There is no bowel obstruction or active inflammation. The appendix is normal. Vascular/Lymphatic: Advanced aortoiliac atherosclerotic disease. The IVC is unremarkable. No portal venous gas. There is no adenopathy. Reproductive: Hysterectomy.  No adnexal masses. Other: None Musculoskeletal: Osteopenia with degenerative changes of the spine. No acute osseous pathology.  IMPRESSION: 1. No  acute intra-abdominal or pelvic pathology. 2. Cholelithiasis. 3. Sigmoid diverticulosis. No bowel obstruction. Normal appendix. 4.  Aortic Atherosclerosis (ICD10-I70.0). Electronically Signed   By: Elgie Collard M.D.   On: 01/05/2023 23:33   DG Chest Portable 1 View  Result Date: 01/05/2023 CLINICAL DATA:  Fall, weakness. EXAM: PORTABLE CHEST 1 VIEW COMPARISON:  04/15/2022 FINDINGS: Prior median sternotomy. Stable cardiomegaly with lead less pacemaker. Aortic atherosclerosis. Low lung volumes with bronchovascular crowding and likely vascular congestion. No large pleural effusion or confluent airspace disease. No pneumothorax. On limited assessment, no acute osseous findings. IMPRESSION: Low lung volumes with bronchovascular crowding and likely vascular congestion. Electronically Signed   By: Narda Rutherford M.D.   On: 01/05/2023 21:54   DG Hip Unilat  With Pelvis 2-3 Views Right  Result Date: 01/05/2023 CLINICAL DATA:  Fall.  Patient fell 3 days EXAM: DG HIP (WITH OR WITHOUT PELVIS) 2-3V RIGHT COMPARISON:  None Available. FINDINGS: There is no evidence of hip fracture or dislocation. Mild bilateral hip osteoarthritis. Degenerate disc disease of the lower lumbar spine. Prominent vascular calcifications. IMPRESSION: 1. No acute fracture or dislocation. 2. Mild bilateral hip osteoarthritis. Electronically Signed   By: Larose Hires D.O.   On: 01/05/2023 20:41   CT HEAD WO CONTRAST ( )  Result Date: 01/05/2023 CLINICAL DATA:  Head trauma EXAM: CT HEAD WITHOUT CONTRAST TECHNIQUE: Contiguous axial images were obtained from the base of the skull through the vertex without intravenous contrast. RADIATION DOSE REDUCTION: This exam was performed according to the departmental dose-optimization program which includes automated exposure control, adjustment of the mA and/or kV according to patient size and/or use of iterative reconstruction technique. COMPARISON:  Head CT 04/15/2022 FINDINGS:  Brain: No evidence of acute infarction, hemorrhage, hydrocephalus, extra-axial collection or mass lesion/mass effect. There is mild diffuse atrophy. There is extensive periventricular and deep white matter hypodensity which is similar to the prior study. There are old lacunar infarcts in the bilateral basal ganglia. Vascular: Atherosclerotic calcifications are present within the cavernous internal carotid arteries. Skull: Normal. Negative for fracture or focal lesion. Sinuses/Orbits: No acute finding. Other: None. IMPRESSION: 1. No acute intracranial process. 2. Stable atrophy and chronic small vessel ischemic changes. Electronically Signed   By: Darliss Cheney M.D.   On: 01/05/2023 20:37     Assessment and Plan:   Chronic atrial fibrillation -On arrival to the emergency department was noted to have rate of 119 on EKG which is still rate controlled for atrial fibrillation -Currently continued on apixaban 5 mg twice daily for CHA2DS2-VASc score of at least 8 for stroke prophylaxis, she does not currently be indication for reduced dosage -Continue with telemetry monitoring -Starting metoprolol to tartrate 12.5 mg twice daily for better rate control and soft blood pressures  Coronary artery disease status post CABG with elevated high-sensitivity troponin -Patient denies any chest pain or previous chest pain -High-sensitivity troponin trended 145, 162, 191 -Likely supply demand mismatch with elevated heart rates from atrial fibrillation, hypertension, decreased blood counts, and SIRS -Continue on apixaban and lieu of aspirin and statin therapy -EKG as needed for pain or changes  Chronic systolic congestive heart failure -Echocardiogram done at an outside facility revealing normal LVEF -No complaints of shortness of breath -Continues to maintain oxygen saturations on room air -Lisinopril and furosemide and remains on hold -PTA Toprol-XL on hold -Started on metoprolol tartrate 12.5 mg twice daily  due to hypotension can transition back to Toprol-XL prior to discharge -Monitor for overload as she had fluid resuscitation in  the emergency department -Concern for right lower extremity swelling and discomfort then again in the emergency department visit to [facility] previous ultrasound completed that showed no evidence of DVT to the lower extremity and no evidence of obstruction proximal to the inguinal ligament of the common femoral vein on the right or left -Weights, I's and O's, low-sodium diet  Hypertension with hypotension -PTA medications have been placed on hold -blood pressure 118/84 -improving -started on metoprolol tartrate 12.5 mg twice daily -vital signs per unit protocol  Frailty with baseline dementia -s/p mechanical fall -poor oral intake -PT/OT eval -delirium precautions  Chronic kidney disease -Serum creatinine 1.35, back at baseline -Daily BMP -Monitor urine output -Monitor/trend/replete electrolytes as needed -Avoid nephrotoxic agents were able  Type 2 diabetes -continue insulin -continued management per IM  SIRS -previously received broad-spectrum antibiotics -Presented symptomatic with nausea, lower abdominal pain, headache, poor oral intake, and weakness -Antibiotics were discontinued due to unrevealing infectious source -Follow cultures -Continued management per IM   Risk Assessment/Risk Scores:          CHA2DS2-VASc Score = 8   This indicates a 10.8% annual risk of stroke. The patient's score is based upon: CHF History: 1 HTN History: 1 Diabetes History: 0 Stroke History: 2 Vascular Disease History: 1 Age Score: 2 Gender Score: 1         For questions or updates, please contact [facility] Please consult [email or web address] for contact info under    Signed, Perina Salvaggio, NP  01/07/2023 12:41 PM

## 2023-01-08 DIAGNOSIS — E119 Type 2 diabetes mellitus without complications: Secondary | ICD-10-CM

## 2023-01-08 DIAGNOSIS — I4891 Unspecified atrial fibrillation: Secondary | ICD-10-CM

## 2023-01-08 DIAGNOSIS — R54 Age-related physical debility: Secondary | ICD-10-CM | POA: Diagnosis not present

## 2023-01-08 DIAGNOSIS — R7989 Other specified abnormal findings of blood chemistry: Secondary | ICD-10-CM | POA: Diagnosis not present

## 2023-01-08 DIAGNOSIS — W19XXXA Unspecified fall, initial encounter: Secondary | ICD-10-CM | POA: Diagnosis not present

## 2023-01-08 DIAGNOSIS — R6510 Systemic inflammatory response syndrome (SIRS) of non-infectious origin without acute organ dysfunction: Secondary | ICD-10-CM | POA: Diagnosis not present

## 2023-01-08 LAB — GLUCOSE, CAPILLARY
Glucose-Capillary: 103 mg/dL — ABNORMAL HIGH (ref 70–99)
Glucose-Capillary: 122 mg/dL — ABNORMAL HIGH (ref 70–99)

## 2023-01-08 LAB — CULTURE, BLOOD (ROUTINE X 2): Culture: NO GROWTH

## 2023-01-08 MED ORDER — METOPROLOL TARTRATE 25 MG PO TABS: 12.5000 mg | ORAL_TABLET | Freq: Two times a day (BID) | ORAL | 2 refills | Status: AC

## 2023-01-08 NOTE — Care Management Important Message (Signed)
Important Message  Patient Details  Name: Jody Stewart MRN: [id number] Date of Birth: 14-May-1946   Medicare Important Message Given:  Yes     Olegario Messier A Ashaunti Treptow 01/08/2023, 10:31 AM

## 2023-01-08 NOTE — Discharge Summary (Addendum)
Physician Discharge Summary   Patient: Jody Stewart MRN: [id number] DOB: 04/20/1946  Admit date:     01/05/2023  Discharge date: 01/08/2023  Discharge Physician: Marcelino Duster   PCP: Etheleen Nicks, NP   Recommendations at discharge:    PCP, cardiology as scheduled. Keep a log of BP, HR for beta blocker titration.  Discharge Diagnoses: Principal Problem:   SIRS (systemic inflammatory response syndrome) (HCC) Active Problems:   Elevated troponin   Frailty   History of bleeding peptic ulcer 08/2022   Nausea   COPD (chronic obstructive pulmonary disease) (HCC)   Hypertension   Diabetes mellitus without complication (HCC)   Chronic atrial fibrillation with RVR (HCC)   Fall   Generalized weakness  Resolved Problems:   * No resolved hospital problems. *  Hospital Course: Jody Stewart is a 77 y.o. female with medical history significant for diabetes, hypertension, stroke, CAD s/p CABG, pacemaker for SSS, systolic CHF (EF 40% 09/2022) atrial fibrillation on Eliquis, dementia, COPD who presents to the ED with generalized weakness since falling a couple days prior when she was unable to get up staying on the ground for over 20 minutes.  Since that time she has been more somnolent and has had decreased oral intake.    Assessment and Plan: * SIRS (systemic inflammatory response syndrome) (HCC) Sepsis ruled out. Asymptomatic, afebrile.  HR elevated due to RVR, Blood pressure lower side but stable. Lab and imaging unrevealing for infectious source No antibiotics needed. She remains afebrile.   Elevated troponin CAD s/p CABG Chronic systolic CHF (EF 98% 09/2022) S/p pacemaker/AICD for SSS Troponin 140-190.  EKG showed A-fib. This is possibly due to demand ischemia from rapid A-fib. Patient denies chest pain. Seen by Cardiology advised metoprolol low dose for Afib. Rate better controlled. BP lower side, advised patient's son to keep log of BP and HR for PCP to titrate beta  blocker.   History of bleeding peptic ulcer 08/2022 Hb stable. Patient is on Eliquis therapy. No active bleeding noted.   Frailty Fall Dementia Elderly female with poor oral intake and falls CK improved. HH services for PT. Continue Aricept Delirium precautions   Nausea CT abdomen and pelvis unrevealing Continue antiemetics. She is able to tolerate diet.   Chronic atrial fibrillation with RVR (HCC) Possibly triggered by dehydration versus SIRS. Continue metoprolol 12.5 mg twice daily as per cardiology recommendation. Continue home Eliquis.   Diabetes mellitus without complication (HCC) Continue Accu-Cheks, sliding scale insulin coverage   Hypertension Norvasc stopped, started metoprolol therapy.   COPD (chronic obstructive pulmonary disease) (HCC) Not acutely exacerbated. DuoNebs as needed   She is hemodynamically stable to be discharged home. Advised PCP follow up. HHPT set up        Consultants: Cardiology  Procedures performed: none  Disposition: Home health Diet recommendation:  Discharge Diet Orders (From admission, onward)     Start     Ordered   01/08/23 0000  Diet - low sodium heart healthy        01/08/23 1312           Cardiac and Carb modified diet DISCHARGE MEDICATION: Allergies as of 01/08/2023   No Known Allergies      Medication List     STOP taking these medications    amLODipine 5 MG tablet Commonly known as: NORVASC   donepezil 5 MG tablet Commonly known as: ARICEPT   furosemide 20 MG tablet Commonly known as: LASIX   lisinopril 20 MG tablet Commonly known  as: ZESTRIL   metoprolol succinate 25 MG 24 hr tablet Commonly known as: TOPROL-XL       TAKE these medications    acetaminophen 650 MG CR tablet Commonly known as: TYLENOL Take 650 mg by mouth every 8 (eight) hours as needed for pain.   albuterol 108 (90 Base) MCG/ACT inhaler Commonly known as: VENTOLIN HFA Inhale 1-2 puffs into the lungs every 6 (six) hours  as needed for wheezing or shortness of breath.   Eliquis 5 MG Tabs tablet Generic drug: apixaban Take 5 mg by mouth 2 (two) times daily.   metFORMIN 500 MG tablet Commonly known as: GLUCOPHAGE Take 500 mg by mouth daily with breakfast.   metoprolol tartrate 25 MG tablet Commonly known as: LOPRESSOR Take 0.5 tablets (12.5 mg total) by mouth 2 (two) times daily.   mupirocin cream 2 % Commonly known as: BACTROBAN AAA BID until healed.   oxybutynin 5 MG tablet Commonly known as: DITROPAN Take 5 mg by mouth daily.   pantoprazole 40 MG tablet Commonly known as: PROTONIX Take 40 mg by mouth 2 (two) times daily.   rosuvastatin 40 MG tablet Commonly known as: CRESTOR Take 40 mg by mouth daily.   sertraline 50 MG tablet Commonly known as: ZOLOFT Take 50 mg by mouth every morning.   traMADol 50 MG tablet Commonly known as: ULTRAM Take 1 tablet (50 mg total) by mouth every 12 (twelve) hours as needed for moderate pain or severe pain.        Discharge Exam: Filed Weights   01/06/23 0156 01/07/23 2234  Weight: 70.3 kg 75.4 kg   General - Elderly looking Caucasian female, no distress HEENT - PERRLA, EOMI, atraumatic head, non tender sinuses. Lung - Clear, bibasilar Rales. Heart - S1, S2 heard, no murmurs, rubs, trace pedal edema Neuro -alert, awake, non focal neuro exam. Skin - Warm and dry.  Condition at discharge: stable  The results of significant diagnostics from this hospitalization (including imaging, microbiology, ancillary and laboratory) are listed below for reference.   Imaging Studies: US Venous Img Lower Unilateral Right (DVT)  Result Date: 01/08/2023 CLINICAL DATA:  Right sided lower extremity swelling EXAM: RIGHT LOWER EXTREMITY VENOUS DOPPLER ULTRASOUND TECHNIQUE: Gray-scale sonography with compression, as well as color and duplex ultrasound, were performed to evaluate the deep venous system(s) from the level of the common femoral vein through the  popliteal and proximal calf veins. COMPARISON:  None Available. FINDINGS: VENOUS Normal compressibility of the common femoral, superficial femoral, and popliteal veins, as well as the visualized calf veins. Visualized portions of profunda femoral vein and great saphenous vein unremarkable. No filling defects to suggest DVT on grayscale or color Doppler imaging. Doppler waveforms show normal direction of venous flow, normal respiratory plasticity and response to augmentation. Limited views of the contralateral common femoral vein are unremarkable. OTHER None. Limitations: none IMPRESSION: Negative. Electronically Signed   By: Malachy Moan M.D.   On: 01/08/2023 07:27   CT ABDOMEN PELVIS WO CONTRAST  Result Date: 01/05/2023 CLINICAL DATA:  Abdominal pain. EXAM: CT ABDOMEN AND PELVIS WITHOUT CONTRAST TECHNIQUE: Multidetector CT imaging of the abdomen and pelvis was performed following the standard protocol without IV contrast. RADIATION DOSE REDUCTION: This exam was performed according to the departmental dose-optimization program which includes automated exposure control, adjustment of the mA and/or kV according to patient size and/or use of iterative reconstruction technique. COMPARISON:  None Available. FINDINGS: Evaluation of this exam is limited in the absence of intravenous contrast. Lower chest:  Left lung base linear atelectasis/scarring. The visualized lung bases are otherwise clear. There is mild cardiomegaly. Coronary vascular calcification and calcification of the mitral annulus. A cardiac pacer noted. No intra-abdominal free air or free fluid. Hepatobiliary: The liver is unremarkable. No biliary dilatation. Several large stones within the gallbladder. No pericholecystic fluid or evidence of acute cholecystitis by CT. Ultrasound may provide better evaluation of the gallbladder if there is clinical concern for acute cholecystitis. Pancreas: The pancreas is unremarkable.  No active inflammation. Spleen:  Normal in size without focal abnormality. Adrenals/Urinary Tract: The adrenal glands unremarkable. Vascular calcifications versus less likely tiny nonobstructing bilateral renal calculi. There is no hydronephrosis on either side. A 1 cm exophytic lesion arising from the upper pole of the right kidney is not characterized on this CT. Further evaluation with ultrasound on a nonemergent/outpatient basis recommended. The visualized ureters and urinary bladder appear unremarkable. Stomach/Bowel: There is sigmoid diverticulosis without active inflammatory changes. There is no bowel obstruction or active inflammation. The appendix is normal. Vascular/Lymphatic: Advanced aortoiliac atherosclerotic disease. The IVC is unremarkable. No portal venous gas. There is no adenopathy. Reproductive: Hysterectomy.  No adnexal masses. Other: None Musculoskeletal: Osteopenia with degenerative changes of the spine. No acute osseous pathology. IMPRESSION: 1. No acute intra-abdominal or pelvic pathology. 2. Cholelithiasis. 3. Sigmoid diverticulosis. No bowel obstruction. Normal appendix. 4.  Aortic Atherosclerosis (ICD10-I70.0). Electronically Signed   By: Elgie Collard M.D.   On: 01/05/2023 23:33   DG Chest Portable 1 View  Result Date: 01/05/2023 CLINICAL DATA:  Fall, weakness. EXAM: PORTABLE CHEST 1 VIEW COMPARISON:  04/15/2022 FINDINGS: Prior median sternotomy. Stable cardiomegaly with lead less pacemaker. Aortic atherosclerosis. Low lung volumes with bronchovascular crowding and likely vascular congestion. No large pleural effusion or confluent airspace disease. No pneumothorax. On limited assessment, no acute osseous findings. IMPRESSION: Low lung volumes with bronchovascular crowding and likely vascular congestion. Electronically Signed   By: Narda Rutherford M.D.   On: 01/05/2023 21:54   DG Hip Unilat  With Pelvis 2-3 Views Right  Result Date: 01/05/2023 CLINICAL DATA:  Fall.  Patient fell 3 days EXAM: DG HIP (WITH OR  WITHOUT PELVIS) 2-3V RIGHT COMPARISON:  None Available. FINDINGS: There is no evidence of hip fracture or dislocation. Mild bilateral hip osteoarthritis. Degenerate disc disease of the lower lumbar spine. Prominent vascular calcifications. IMPRESSION: 1. No acute fracture or dislocation. 2. Mild bilateral hip osteoarthritis. Electronically Signed   By: Larose Hires D.O.   On: 01/05/2023 20:41   CT HEAD WO CONTRAST ( )  Result Date: 01/05/2023 CLINICAL DATA:  Head trauma EXAM: CT HEAD WITHOUT CONTRAST TECHNIQUE: Contiguous axial images were obtained from the base of the skull through the vertex without intravenous contrast. RADIATION DOSE REDUCTION: This exam was performed according to the departmental dose-optimization program which includes automated exposure control, adjustment of the mA and/or kV according to patient size and/or use of iterative reconstruction technique. COMPARISON:  Head CT 04/15/2022 FINDINGS: Brain: No evidence of acute infarction, hemorrhage, hydrocephalus, extra-axial collection or mass lesion/mass effect. There is mild diffuse atrophy. There is extensive periventricular and deep white matter hypodensity which is similar to the prior study. There are old lacunar infarcts in the bilateral basal ganglia. Vascular: Atherosclerotic calcifications are present within the cavernous internal carotid arteries. Skull: Normal. Negative for fracture or focal lesion. Sinuses/Orbits: No acute finding. Other: None. IMPRESSION: 1. No acute intracranial process. 2. Stable atrophy and chronic small vessel ischemic changes. Electronically Signed   By: Mcneil Sober.D.  On: 01/05/2023 20:37    Microbiology: Results for orders placed or performed during the hospital encounter of 01/05/23  Blood culture (routine x 2)     Status: None (Preliminary result)   Collection Time: 01/05/23 11:36 PM   Specimen: BLOOD  Result Value Ref Range Status   Specimen Description BLOOD  RIGHT FOREARM  Final    Special Requests   Final    BOTTLES DRAWN AEROBIC AND ANAEROBIC Blood Culture results may not be optimal due to an inadequate volume of blood received in culture bottles   Culture   Final    NO GROWTH 3 DAYS Performed at [facility], [address], [place], [place] [address]    Report Status PENDING  Incomplete  Blood culture (routine x 2)     Status: None (Preliminary result)   Collection Time: 01/05/23 11:36 PM   Specimen: BLOOD  Result Value Ref Range Status   Specimen Description BLOOD  LEFT WRIST  Final   Special Requests   Final    BOTTLES DRAWN AEROBIC AND ANAEROBIC Blood Culture results may not be optimal due to an inadequate volume of blood received in culture bottles   Culture   Final    NO GROWTH 3 DAYS Performed at [facility], [address] [place], [place] [address]    Report Status PENDING  Incomplete  SARS Coronavirus 2 by RT PCR (hospital order, performed in [facility] hospital lab) *cepheid single result test* Anterior Nasal Swab     Status: None   Collection Time: 01/06/23 12:12 AM   Specimen: Anterior Nasal Swab  Result Value Ref Range Status   SARS Coronavirus 2 by RT PCR NEGATIVE NEGATIVE Final    Comment: (NOTE) SARS-CoV-2 target nucleic acids are NOT DETECTED.  The SARS-CoV-2 RNA is generally detectable in upper and lower respiratory specimens during the acute phase of infection. The lowest concentration of SARS-CoV-2 viral copies this assay can detect is 250 copies / mL. A negative result does not preclude SARS-CoV-2 infection and should not be used as the sole basis for treatment or other patient management decisions.  A negative result may occur with improper specimen collection / handling, submission of specimen other than nasopharyngeal swab, presence of viral mutation(s) within the areas targeted by this assay, and inadequate number of viral copies (<250 copies / mL). A negative result must be combined with  clinical observations, patient history, and epidemiological information.  Fact Sheet for Patients:   RoadLapTop.co.za  Fact Sheet for Healthcare Providers: http://kim-miller.com/  This test is not yet approved or  cleared by the [place] FDA and has been authorized for detection and/or diagnosis of SARS-CoV-2 by FDA under an Emergency Use Authorization (EUA).  This EUA will remain in effect (meaning this test can be used) for the duration of the COVID-19 declaration under Section 564(b)(1) of the Act, 21 U.S.C. section 360bbb-3(b)(1), unless the authorization is terminated or revoked sooner.  Performed at [facility], [address] [place], [place] [address]     Labs: CBC: Recent Labs  Lab 01/05/23 2007 01/06/23 0243 01/07/23 0936  WBC 7.4 6.4 4.6  HGB 10.5* 9.0* 9.1*  HCT 33.9* 29.6* 29.4*  MCV 88.1 88.9 87.8  PLT 210 187 181   Basic Metabolic Panel: Recent Labs  Lab 01/05/23 2007 01/07/23 0936  NA 136 141  K 3.7 3.8  CL 105 111  CO2 20* 24  GLUCOSE 132* 89  BUN 23 14  CREATININE 1.65* 1.35*  CALCIUM 9.0 8.5*   Liver Function Tests: Recent Labs  Lab 01/05/23 2137  AST 23  ALT 13  ALKPHOS 79  BILITOT 0.9  PROT 7.6  ALBUMIN 4.0   CBG: Recent Labs  Lab 01/07/23 1147 01/07/23 1653 01/07/23 2132 01/08/23 0842 01/08/23 1140  GLUCAP 79 114* 163* 103* 122*    Discharge time spent: greater than 30 minutes.  Signed: Marcelino Duster, MD [facility] 01/08/2023

## 2023-01-08 NOTE — Progress Notes (Addendum)
Notified Dr. Clide Dales of BP: 102/58. Okay to give metoprolol, per MD.

## 2023-01-08 NOTE — Plan of Care (Signed)
  Problem: Fluid Volume: Goal: Hemodynamic stability will improve Outcome: Progressing   Problem: Clinical Measurements: Goal: Diagnostic test results will improve Outcome: Progressing   Problem: Nutritional: Goal: Maintenance of adequate nutrition will improve Outcome: Progressing Goal: Progress toward achieving an optimal weight will improve Outcome: Progressing   Problem: Skin Integrity: Goal: Risk for impaired skin integrity will decrease Outcome: Progressing   Problem: Tissue Perfusion: Goal: Adequacy of tissue perfusion will improve Outcome: Progressing   Problem: Respiratory: Goal: Ability to maintain adequate ventilation will improve Outcome: Not Progressing

## 2023-01-09 LAB — CULTURE, BLOOD (ROUTINE X 2)

## 2023-01-10 LAB — CULTURE, BLOOD (ROUTINE X 2): Culture: NO GROWTH

## 2023-04-30 DEATH — deceased
